# Patient Record
Sex: Female | Born: 1935 | Race: White | Hispanic: No | Smoking: Never smoker
Health system: Southern US, Community
[De-identification: ages and names within clinical notes are randomized; demographics above are authoritative.]

## PROBLEM LIST (undated history)

## (undated) DIAGNOSIS — I4891 Unspecified atrial fibrillation: Secondary | ICD-10-CM

## (undated) DIAGNOSIS — N189 Chronic kidney disease, unspecified: Secondary | ICD-10-CM

## (undated) DIAGNOSIS — I340 Nonrheumatic mitral (valve) insufficiency: Secondary | ICD-10-CM

## (undated) DIAGNOSIS — C50919 Malignant neoplasm of unspecified site of unspecified female breast: Secondary | ICD-10-CM

## (undated) DIAGNOSIS — R911 Solitary pulmonary nodule: Secondary | ICD-10-CM

## (undated) DIAGNOSIS — M67919 Unspecified disorder of synovium and tendon, unspecified shoulder: Secondary | ICD-10-CM

## (undated) DIAGNOSIS — F329 Major depressive disorder, single episode, unspecified: Secondary | ICD-10-CM

## (undated) DIAGNOSIS — I428 Other cardiomyopathies: Secondary | ICD-10-CM

## (undated) DIAGNOSIS — I5022 Chronic systolic (congestive) heart failure: Secondary | ICD-10-CM

## (undated) DIAGNOSIS — F0390 Unspecified dementia without behavioral disturbance: Secondary | ICD-10-CM

## (undated) DIAGNOSIS — I10 Essential (primary) hypertension: Secondary | ICD-10-CM

## (undated) DIAGNOSIS — I712 Thoracic aortic aneurysm, without rupture, unspecified: Secondary | ICD-10-CM

## (undated) DIAGNOSIS — G4700 Insomnia, unspecified: Secondary | ICD-10-CM

## (undated) DIAGNOSIS — E785 Hyperlipidemia, unspecified: Secondary | ICD-10-CM

## (undated) DIAGNOSIS — Z9289 Personal history of other medical treatment: Secondary | ICD-10-CM

## (undated) DIAGNOSIS — M419 Scoliosis, unspecified: Secondary | ICD-10-CM

## (undated) DIAGNOSIS — E039 Hypothyroidism, unspecified: Secondary | ICD-10-CM

## (undated) DIAGNOSIS — I447 Left bundle-branch block, unspecified: Secondary | ICD-10-CM

## (undated) DIAGNOSIS — Z8701 Personal history of pneumonia (recurrent): Secondary | ICD-10-CM

## (undated) DIAGNOSIS — F32A Depression, unspecified: Secondary | ICD-10-CM

## (undated) DIAGNOSIS — I350 Nonrheumatic aortic (valve) stenosis: Secondary | ICD-10-CM

## (undated) DIAGNOSIS — J449 Chronic obstructive pulmonary disease, unspecified: Secondary | ICD-10-CM

## (undated) HISTORY — PX: BREAST LUMPECTOMY: SHX2

## (undated) HISTORY — DX: Left bundle-branch block, unspecified: I44.7

## (undated) HISTORY — DX: Malignant neoplasm of unspecified site of unspecified female breast: C50.919

## (undated) HISTORY — DX: Essential (primary) hypertension: I10

## (undated) HISTORY — DX: Nonrheumatic aortic (valve) stenosis: I35.0

## (undated) HISTORY — DX: Unspecified atrial fibrillation: I48.91

## (undated) HISTORY — DX: Chronic kidney disease, unspecified: N18.9

## (undated) HISTORY — DX: Chronic systolic (congestive) heart failure: I50.22

## (undated) HISTORY — DX: Unspecified disorder of synovium and tendon, unspecified shoulder: M67.919

## (undated) HISTORY — DX: Unspecified dementia, unspecified severity, without behavioral disturbance, psychotic disturbance, mood disturbance, and anxiety: F03.90

## (undated) HISTORY — DX: Thoracic aortic aneurysm, without rupture: I71.2

## (undated) HISTORY — DX: Chronic obstructive pulmonary disease, unspecified: J44.9

## (undated) HISTORY — DX: Hyperlipidemia, unspecified: E78.5

## (undated) HISTORY — DX: Major depressive disorder, single episode, unspecified: F32.9

## (undated) HISTORY — PX: CATARACT EXTRACTION: SUR2

## (undated) HISTORY — DX: Scoliosis, unspecified: M41.9

## (undated) HISTORY — DX: Thoracic aortic aneurysm, without rupture, unspecified: I71.20

## (undated) HISTORY — DX: Nonrheumatic mitral (valve) insufficiency: I34.0

## (undated) HISTORY — DX: Solitary pulmonary nodule: R91.1

## (undated) HISTORY — DX: Personal history of pneumonia (recurrent): Z87.01

## (undated) HISTORY — DX: Personal history of other medical treatment: Z92.89

## (undated) HISTORY — DX: Hypothyroidism, unspecified: E03.9

## (undated) HISTORY — DX: Depression, unspecified: F32.A

## (undated) HISTORY — DX: Other cardiomyopathies: I42.8

## (undated) HISTORY — DX: Insomnia, unspecified: G47.00

---

## 1976-05-27 HISTORY — PX: OTHER SURGICAL HISTORY: SHX169

## 1993-05-27 DIAGNOSIS — C50919 Malignant neoplasm of unspecified site of unspecified female breast: Secondary | ICD-10-CM

## 1993-05-27 HISTORY — DX: Malignant neoplasm of unspecified site of unspecified female breast: C50.919

## 2002-08-10 ENCOUNTER — Ambulatory Visit: Admission: RE | Admit: 2002-08-10 | Discharge: 2002-08-10 | Payer: Self-pay | Admitting: Internal Medicine

## 2002-08-10 ENCOUNTER — Encounter: Payer: Self-pay | Admitting: Cardiology

## 2005-05-24 ENCOUNTER — Encounter: Payer: Self-pay | Admitting: Internal Medicine

## 2005-08-25 DIAGNOSIS — I4891 Unspecified atrial fibrillation: Secondary | ICD-10-CM

## 2005-08-25 HISTORY — DX: Unspecified atrial fibrillation: I48.91

## 2005-08-26 ENCOUNTER — Ambulatory Visit: Payer: Self-pay | Admitting: Cardiology

## 2005-08-26 ENCOUNTER — Inpatient Hospital Stay (HOSPITAL_COMMUNITY): Admission: EM | Admit: 2005-08-26 | Discharge: 2005-09-06 | Payer: Self-pay | Admitting: Emergency Medicine

## 2005-08-27 ENCOUNTER — Ambulatory Visit: Payer: Self-pay | Admitting: Internal Medicine

## 2005-08-27 ENCOUNTER — Encounter: Payer: Self-pay | Admitting: Cardiovascular Disease

## 2005-08-29 ENCOUNTER — Encounter: Payer: Self-pay | Admitting: Cardiology

## 2005-09-03 ENCOUNTER — Ambulatory Visit: Payer: Self-pay | Admitting: Hematology and Oncology

## 2005-09-06 ENCOUNTER — Ambulatory Visit: Payer: Self-pay | Admitting: Gastroenterology

## 2005-09-08 ENCOUNTER — Ambulatory Visit: Payer: Self-pay | Admitting: Cardiology

## 2005-09-08 ENCOUNTER — Inpatient Hospital Stay (HOSPITAL_COMMUNITY): Admission: EM | Admit: 2005-09-08 | Discharge: 2005-09-10 | Payer: Self-pay | Admitting: Emergency Medicine

## 2005-09-18 ENCOUNTER — Ambulatory Visit: Payer: Self-pay | Admitting: Cardiology

## 2005-09-23 ENCOUNTER — Ambulatory Visit: Payer: Self-pay | Admitting: Hematology and Oncology

## 2005-10-22 ENCOUNTER — Ambulatory Visit: Payer: Self-pay | Admitting: Cardiology

## 2005-10-22 ENCOUNTER — Ambulatory Visit: Payer: Self-pay

## 2005-10-22 ENCOUNTER — Encounter: Payer: Self-pay | Admitting: Cardiology

## 2005-10-23 ENCOUNTER — Ambulatory Visit: Payer: Self-pay | Admitting: Internal Medicine

## 2005-11-04 ENCOUNTER — Ambulatory Visit: Payer: Self-pay

## 2005-11-04 ENCOUNTER — Ambulatory Visit: Payer: Self-pay | Admitting: Cardiology

## 2005-11-04 ENCOUNTER — Ambulatory Visit: Admission: RE | Admit: 2005-11-04 | Discharge: 2005-11-04 | Payer: Self-pay | Admitting: Cardiology

## 2005-11-21 ENCOUNTER — Ambulatory Visit: Payer: Self-pay | Admitting: Internal Medicine

## 2005-12-11 ENCOUNTER — Ambulatory Visit: Payer: Self-pay | Admitting: Cardiology

## 2006-01-03 ENCOUNTER — Ambulatory Visit: Payer: Self-pay | Admitting: Hematology and Oncology

## 2006-01-07 LAB — COMPREHENSIVE METABOLIC PANEL WITH GFR
ALT: 10 U/L (ref 0–40)
AST: 14 U/L (ref 0–37)
Albumin: 4.1 g/dL (ref 3.5–5.2)
Alkaline Phosphatase: 83 U/L (ref 39–117)
BUN: 23 mg/dL (ref 6–23)
CO2: 29 meq/L (ref 19–32)
Calcium: 9 mg/dL (ref 8.4–10.5)
Chloride: 106 meq/L (ref 96–112)
Creatinine, Ser: 1.02 mg/dL (ref 0.40–1.20)
Glucose, Bld: 97 mg/dL (ref 70–99)
Potassium: 3.6 meq/L (ref 3.5–5.3)
Sodium: 143 meq/L (ref 135–145)
Total Bilirubin: 1 mg/dL (ref 0.3–1.2)
Total Protein: 6.3 g/dL (ref 6.0–8.3)

## 2006-01-07 LAB — CBC WITH DIFFERENTIAL/PLATELET
EOS%: 2 % (ref 0.0–7.0)
LYMPH%: 28.2 % (ref 14.0–48.0)
MCH: 33 pg (ref 26.0–34.0)
MCV: 96 fL (ref 81.0–101.0)
MONO%: 7.5 % (ref 0.0–13.0)
RBC: 3.62 10*6/uL — ABNORMAL LOW (ref 3.70–5.32)
RDW: 14.3 % (ref 11.3–14.5)

## 2006-01-07 LAB — AFP TUMOR MARKER: AFP-Tumor Marker: 15.6 ng/mL — ABNORMAL HIGH (ref 0.0–8.0)

## 2006-01-20 ENCOUNTER — Ambulatory Visit (HOSPITAL_COMMUNITY): Admission: RE | Admit: 2006-01-20 | Discharge: 2006-01-20 | Payer: Self-pay | Admitting: Hematology and Oncology

## 2006-02-04 ENCOUNTER — Ambulatory Visit: Payer: Self-pay | Admitting: Gastroenterology

## 2006-03-10 ENCOUNTER — Ambulatory Visit: Payer: Self-pay | Admitting: Cardiology

## 2006-03-12 ENCOUNTER — Ambulatory Visit: Payer: Self-pay | Admitting: Cardiology

## 2006-04-11 ENCOUNTER — Ambulatory Visit: Payer: Self-pay | Admitting: Internal Medicine

## 2006-05-06 ENCOUNTER — Ambulatory Visit: Payer: Self-pay | Admitting: Internal Medicine

## 2006-05-07 LAB — CONVERTED CEMR LAB
Calcium: 9.7 mg/dL (ref 8.4–10.5)
Chloride: 107 meq/L (ref 96–112)
GFR calc non Af Amer: 51 mL/min
Glucose, Bld: 102 mg/dL — ABNORMAL HIGH (ref 70–99)
Vitamin B-12: 240 pg/mL (ref 211–911)

## 2006-05-08 ENCOUNTER — Ambulatory Visit: Payer: Self-pay | Admitting: Cardiology

## 2006-06-27 DIAGNOSIS — R911 Solitary pulmonary nodule: Secondary | ICD-10-CM

## 2006-06-27 HISTORY — DX: Solitary pulmonary nodule: R91.1

## 2006-07-02 ENCOUNTER — Ambulatory Visit: Payer: Self-pay | Admitting: Hematology and Oncology

## 2006-07-07 ENCOUNTER — Ambulatory Visit (HOSPITAL_COMMUNITY): Admission: RE | Admit: 2006-07-07 | Discharge: 2006-07-07 | Payer: Self-pay | Admitting: Hematology and Oncology

## 2006-07-07 LAB — CBC WITH DIFFERENTIAL/PLATELET
BASO%: 0.5 % (ref 0.0–2.0)
LYMPH%: 19.3 % (ref 14.0–48.0)
MCHC: 35.1 g/dL (ref 32.0–36.0)
MCV: 96.5 fL (ref 81.0–101.0)
MONO%: 6.9 % (ref 0.0–13.0)
NEUT%: 72.3 % (ref 39.6–76.8)
Platelets: 179 10*3/uL (ref 145–400)
RBC: 4.04 10*6/uL (ref 3.70–5.32)

## 2006-07-07 LAB — COMPREHENSIVE METABOLIC PANEL
ALT: 12 U/L (ref 0–35)
Alkaline Phosphatase: 78 U/L (ref 39–117)
Creatinine, Ser: 0.77 mg/dL (ref 0.40–1.20)
Glucose, Bld: 101 mg/dL — ABNORMAL HIGH (ref 70–99)
Sodium: 142 mEq/L (ref 135–145)
Total Bilirubin: 1.6 mg/dL — ABNORMAL HIGH (ref 0.3–1.2)
Total Protein: 6.8 g/dL (ref 6.0–8.3)

## 2006-07-08 LAB — AFP TUMOR MARKER: AFP-Tumor Marker: 16.7 ng/mL — ABNORMAL HIGH (ref 0.0–8.0)

## 2006-07-08 LAB — FERRITIN: Ferritin: 169 ng/mL (ref 10–291)

## 2006-08-23 ENCOUNTER — Encounter: Admission: RE | Admit: 2006-08-23 | Discharge: 2006-08-23 | Payer: Self-pay | Admitting: Orthopedic Surgery

## 2006-09-18 ENCOUNTER — Ambulatory Visit: Payer: Self-pay | Admitting: Cardiology

## 2006-09-18 LAB — CONVERTED CEMR LAB
Basophils Relative: 0.7 % (ref 0.0–1.0)
CO2: 32 meq/L (ref 19–32)
Eosinophils Relative: 3 % (ref 0.0–5.0)
GFR calc Af Amer: 69 mL/min
Glucose, Bld: 97 mg/dL (ref 70–99)
Hemoglobin: 12.2 g/dL (ref 12.0–15.0)
Lymphocytes Relative: 26.9 % (ref 12.0–46.0)
Monocytes Absolute: 0.3 10*3/uL (ref 0.2–0.7)
Monocytes Relative: 8.4 % (ref 3.0–11.0)
Neutro Abs: 2.2 10*3/uL (ref 1.4–7.7)
Neutrophils Relative %: 61 % (ref 43.0–77.0)
Potassium: 4.4 meq/L (ref 3.5–5.1)
Sodium: 142 meq/L (ref 135–145)
WBC: 3.5 10*3/uL — ABNORMAL LOW (ref 4.5–10.5)

## 2006-10-24 ENCOUNTER — Ambulatory Visit: Payer: Self-pay | Admitting: Cardiology

## 2006-10-28 ENCOUNTER — Ambulatory Visit: Payer: Self-pay | Admitting: Internal Medicine

## 2006-10-28 LAB — CONVERTED CEMR LAB
AST: 16 units/L (ref 0–37)
BUN: 20 mg/dL (ref 6–23)
Basophils Absolute: 0 10*3/uL (ref 0.0–0.1)
Calcium: 9.2 mg/dL (ref 8.4–10.5)
Eosinophils Absolute: 0.1 10*3/uL (ref 0.0–0.6)
GFR calc Af Amer: 69 mL/min
GFR calc non Af Amer: 57 mL/min
Glucose, Bld: 147 mg/dL — ABNORMAL HIGH (ref 70–99)
MCHC: 34.5 g/dL (ref 30.0–36.0)
Monocytes Relative: 9.2 % (ref 3.0–11.0)
Potassium: 3.7 meq/L (ref 3.5–5.1)
RBC: 3.54 M/uL — ABNORMAL LOW (ref 3.87–5.11)
RDW: 12.7 % (ref 11.5–14.6)
TSH: 0.18 microintl units/mL — ABNORMAL LOW (ref 0.35–5.50)
Vitamin B-12: >1500 pg/mL — ABNORMAL HIGH (ref 211–911)

## 2006-12-12 DIAGNOSIS — I10 Essential (primary) hypertension: Secondary | ICD-10-CM | POA: Insufficient documentation

## 2006-12-12 DIAGNOSIS — E039 Hypothyroidism, unspecified: Secondary | ICD-10-CM | POA: Insufficient documentation

## 2006-12-12 DIAGNOSIS — Z853 Personal history of malignant neoplasm of breast: Secondary | ICD-10-CM

## 2006-12-25 ENCOUNTER — Ambulatory Visit: Payer: Self-pay | Admitting: Internal Medicine

## 2006-12-26 ENCOUNTER — Encounter: Payer: Self-pay | Admitting: Internal Medicine

## 2006-12-26 DIAGNOSIS — M159 Polyosteoarthritis, unspecified: Secondary | ICD-10-CM | POA: Insufficient documentation

## 2006-12-26 DIAGNOSIS — I4891 Unspecified atrial fibrillation: Secondary | ICD-10-CM

## 2006-12-26 DIAGNOSIS — E785 Hyperlipidemia, unspecified: Secondary | ICD-10-CM

## 2006-12-26 DIAGNOSIS — I5022 Chronic systolic (congestive) heart failure: Secondary | ICD-10-CM | POA: Insufficient documentation

## 2006-12-26 DIAGNOSIS — N259 Disorder resulting from impaired renal tubular function, unspecified: Secondary | ICD-10-CM | POA: Insufficient documentation

## 2007-02-06 ENCOUNTER — Ambulatory Visit: Payer: Self-pay | Admitting: Internal Medicine

## 2007-02-06 LAB — CONVERTED CEMR LAB
ALT: 16 units/L (ref 0–35)
LDL Cholesterol: 71 mg/dL (ref 0–99)
Triglycerides: 53 mg/dL (ref 0–149)
VLDL: 11 mg/dL (ref 0–40)

## 2007-03-09 ENCOUNTER — Ambulatory Visit: Payer: Self-pay | Admitting: Internal Medicine

## 2007-04-27 ENCOUNTER — Ambulatory Visit: Payer: Self-pay | Admitting: Internal Medicine

## 2007-04-30 ENCOUNTER — Telehealth: Payer: Self-pay | Admitting: Internal Medicine

## 2007-06-10 ENCOUNTER — Ambulatory Visit: Payer: Self-pay | Admitting: Cardiology

## 2007-07-08 ENCOUNTER — Ambulatory Visit: Payer: Self-pay | Admitting: Hematology and Oncology

## 2007-07-10 ENCOUNTER — Encounter: Payer: Self-pay | Admitting: Internal Medicine

## 2007-07-10 ENCOUNTER — Ambulatory Visit (HOSPITAL_COMMUNITY): Admission: RE | Admit: 2007-07-10 | Discharge: 2007-07-10 | Payer: Self-pay | Admitting: Hematology and Oncology

## 2007-07-10 ENCOUNTER — Ambulatory Visit: Payer: Self-pay | Admitting: Internal Medicine

## 2007-07-10 DIAGNOSIS — H9190 Unspecified hearing loss, unspecified ear: Secondary | ICD-10-CM | POA: Insufficient documentation

## 2007-07-10 LAB — COMPREHENSIVE METABOLIC PANEL
ALT: 15 U/L (ref 0–35)
Albumin: 3.7 g/dL (ref 3.5–5.2)
CO2: 32 mEq/L (ref 19–32)
Calcium: 9.1 mg/dL (ref 8.4–10.5)
Chloride: 104 mEq/L (ref 96–112)
Potassium: 4.3 mEq/L (ref 3.5–5.3)
Sodium: 140 mEq/L (ref 135–145)
Total Protein: 6.1 g/dL (ref 6.0–8.3)

## 2007-07-10 LAB — CBC WITH DIFFERENTIAL/PLATELET
BASO%: 0 % (ref 0.0–2.0)
HCT: 36.6 % (ref 34.8–46.6)
MCHC: 32.8 g/dL (ref 32.0–36.0)
MONO#: 0.2 10*3/uL (ref 0.1–0.9)
NEUT%: 61.4 % (ref 39.6–76.8)
RBC: 3.78 10*6/uL (ref 3.70–5.32)
WBC: 3.2 10*3/uL — ABNORMAL LOW (ref 3.9–10.0)
lymph#: 0.9 10*3/uL (ref 0.9–3.3)

## 2007-07-10 LAB — CANCER ANTIGEN 27.29: CA 27.29: 25 U/mL (ref 0–39)

## 2007-07-10 LAB — CONVERTED CEMR LAB

## 2007-07-13 LAB — CONVERTED CEMR LAB
ALT: 14 units/L (ref 0–35)
CO2: 32 meq/L (ref 19–32)
Chloride: 108 meq/L (ref 96–112)
Creatinine, Ser: 1.2 mg/dL (ref 0.4–1.2)
Glucose, Bld: 108 mg/dL — ABNORMAL HIGH (ref 70–99)
Potassium: 4.7 meq/L (ref 3.5–5.1)
Sodium: 144 meq/L (ref 135–145)

## 2007-08-05 ENCOUNTER — Encounter: Payer: Self-pay | Admitting: Internal Medicine

## 2007-08-06 ENCOUNTER — Encounter: Payer: Self-pay | Admitting: Internal Medicine

## 2007-08-12 ENCOUNTER — Encounter: Payer: Self-pay | Admitting: Internal Medicine

## 2007-09-21 ENCOUNTER — Telehealth: Payer: Self-pay | Admitting: Internal Medicine

## 2007-11-06 ENCOUNTER — Encounter: Payer: Self-pay | Admitting: Internal Medicine

## 2007-11-10 ENCOUNTER — Telehealth: Payer: Self-pay | Admitting: Internal Medicine

## 2007-12-01 ENCOUNTER — Telehealth: Payer: Self-pay | Admitting: Internal Medicine

## 2007-12-03 ENCOUNTER — Ambulatory Visit: Payer: Self-pay | Admitting: Internal Medicine

## 2007-12-11 ENCOUNTER — Encounter: Payer: Self-pay | Admitting: Internal Medicine

## 2007-12-15 ENCOUNTER — Ambulatory Visit: Payer: Self-pay | Admitting: Cardiology

## 2007-12-15 ENCOUNTER — Ambulatory Visit: Payer: Self-pay | Admitting: Internal Medicine

## 2007-12-18 ENCOUNTER — Telehealth: Payer: Self-pay | Admitting: Internal Medicine

## 2007-12-28 ENCOUNTER — Ambulatory Visit: Payer: Self-pay

## 2007-12-28 ENCOUNTER — Encounter: Payer: Self-pay | Admitting: Internal Medicine

## 2007-12-28 ENCOUNTER — Ambulatory Visit: Payer: Self-pay | Admitting: Internal Medicine

## 2007-12-28 ENCOUNTER — Encounter: Payer: Self-pay | Admitting: Cardiology

## 2007-12-28 LAB — CONVERTED CEMR LAB
ALT: 12 units/L (ref 0–35)
Alkaline Phosphatase: 66 units/L (ref 39–117)
Basophils Absolute: 0 10*3/uL (ref 0.0–0.1)
Bilirubin, Direct: 0.2 mg/dL (ref 0.0–0.3)
CO2: 32 meq/L (ref 19–32)
Calcium: 9.1 mg/dL (ref 8.4–10.5)
Cholesterol: 170 mg/dL (ref 0–200)
Glucose, Bld: 94 mg/dL (ref 70–99)
HDL: 64.1 mg/dL (ref >39.0)
LDL Cholesterol: 91 mg/dL (ref 0–99)
Lymphocytes Relative: 23.5 % (ref 12.0–46.0)
MCHC: 34.8 g/dL (ref 30.0–36.0)
Monocytes Relative: 7.9 % (ref 3.0–12.0)
Neutrophils Relative %: 64.7 % (ref 43.0–77.0)
Platelets: 179 10*3/uL (ref 150–400)
Potassium: 4.6 meq/L (ref 3.5–5.1)
RDW: 12.5 % (ref 11.5–14.6)
Sodium: 143 meq/L (ref 135–145)
TSH: 1.38 microintl units/mL (ref 0.35–5.50)
Total Bilirubin: 1.2 mg/dL (ref 0.3–1.2)
Total CHOL/HDL Ratio: 2.7
Total Protein: 5.9 g/dL — ABNORMAL LOW (ref 6.0–8.3)
Triglycerides: 73 mg/dL (ref 0–149)
VLDL: 15 mg/dL (ref 0–40)

## 2007-12-29 ENCOUNTER — Ambulatory Visit: Payer: Self-pay

## 2007-12-29 ENCOUNTER — Encounter: Payer: Self-pay | Admitting: Internal Medicine

## 2008-01-19 ENCOUNTER — Encounter: Payer: Self-pay | Admitting: Internal Medicine

## 2008-01-26 ENCOUNTER — Encounter: Payer: Self-pay | Admitting: Internal Medicine

## 2008-01-29 ENCOUNTER — Encounter: Payer: Self-pay | Admitting: Internal Medicine

## 2008-02-17 ENCOUNTER — Telehealth: Payer: Self-pay | Admitting: Internal Medicine

## 2008-02-19 ENCOUNTER — Encounter: Payer: Self-pay | Admitting: Internal Medicine

## 2008-02-24 ENCOUNTER — Encounter: Payer: Self-pay | Admitting: Internal Medicine

## 2008-03-08 ENCOUNTER — Telehealth: Payer: Self-pay | Admitting: Internal Medicine

## 2008-04-20 ENCOUNTER — Ambulatory Visit: Payer: Self-pay | Admitting: Internal Medicine

## 2008-04-27 ENCOUNTER — Telehealth: Payer: Self-pay | Admitting: Internal Medicine

## 2008-05-12 ENCOUNTER — Telehealth: Payer: Self-pay | Admitting: Internal Medicine

## 2008-05-27 DIAGNOSIS — Z8701 Personal history of pneumonia (recurrent): Secondary | ICD-10-CM

## 2008-05-27 HISTORY — DX: Personal history of pneumonia (recurrent): Z87.01

## 2008-06-07 ENCOUNTER — Telehealth: Payer: Self-pay | Admitting: Internal Medicine

## 2008-07-26 ENCOUNTER — Ambulatory Visit: Payer: Self-pay | Admitting: Hematology and Oncology

## 2008-07-29 ENCOUNTER — Telehealth (INDEPENDENT_AMBULATORY_CARE_PROVIDER_SITE_OTHER): Payer: Self-pay | Admitting: *Deleted

## 2008-09-29 ENCOUNTER — Encounter: Payer: Self-pay | Admitting: Internal Medicine

## 2008-10-21 ENCOUNTER — Ambulatory Visit (HOSPITAL_COMMUNITY): Admission: RE | Admit: 2008-10-21 | Discharge: 2008-10-21 | Payer: Self-pay | Admitting: Orthopedic Surgery

## 2008-10-21 ENCOUNTER — Emergency Department (HOSPITAL_COMMUNITY): Admission: EM | Admit: 2008-10-21 | Discharge: 2008-10-21 | Payer: Self-pay | Admitting: Emergency Medicine

## 2008-11-25 ENCOUNTER — Telehealth: Payer: Self-pay | Admitting: Internal Medicine

## 2008-11-29 ENCOUNTER — Ambulatory Visit: Payer: Self-pay | Admitting: Diagnostic Radiology

## 2008-11-29 ENCOUNTER — Ambulatory Visit (HOSPITAL_BASED_OUTPATIENT_CLINIC_OR_DEPARTMENT_OTHER): Admission: RE | Admit: 2008-11-29 | Discharge: 2008-11-29 | Payer: Self-pay | Admitting: Internal Medicine

## 2008-11-29 ENCOUNTER — Ambulatory Visit: Payer: Self-pay | Admitting: Internal Medicine

## 2008-11-29 DIAGNOSIS — F329 Major depressive disorder, single episode, unspecified: Secondary | ICD-10-CM

## 2008-11-29 DIAGNOSIS — F3289 Other specified depressive episodes: Secondary | ICD-10-CM | POA: Insufficient documentation

## 2008-11-30 ENCOUNTER — Telehealth: Payer: Self-pay | Admitting: Internal Medicine

## 2008-12-01 ENCOUNTER — Telehealth: Payer: Self-pay | Admitting: Internal Medicine

## 2008-12-16 ENCOUNTER — Encounter: Admission: RE | Admit: 2008-12-16 | Discharge: 2008-12-16 | Payer: Self-pay | Admitting: General Practice

## 2009-01-18 ENCOUNTER — Ambulatory Visit: Payer: Self-pay | Admitting: Cardiology

## 2009-04-11 ENCOUNTER — Encounter: Payer: Self-pay | Admitting: Cardiology

## 2009-04-25 ENCOUNTER — Encounter: Payer: Self-pay | Admitting: Cardiology

## 2009-05-27 HISTORY — PX: OTHER SURGICAL HISTORY: SHX169

## 2009-05-30 ENCOUNTER — Ambulatory Visit: Payer: Self-pay

## 2009-05-30 ENCOUNTER — Encounter: Payer: Self-pay | Admitting: Cardiology

## 2009-05-30 ENCOUNTER — Ambulatory Visit: Payer: Self-pay | Admitting: Cardiovascular Disease

## 2009-05-30 ENCOUNTER — Ambulatory Visit (HOSPITAL_COMMUNITY): Admission: RE | Admit: 2009-05-30 | Discharge: 2009-05-30 | Payer: Self-pay | Admitting: Cardiology

## 2009-06-01 ENCOUNTER — Telehealth (INDEPENDENT_AMBULATORY_CARE_PROVIDER_SITE_OTHER): Payer: Self-pay | Admitting: *Deleted

## 2009-06-05 ENCOUNTER — Telehealth: Payer: Self-pay | Admitting: Internal Medicine

## 2009-07-03 ENCOUNTER — Telehealth: Payer: Self-pay | Admitting: Cardiology

## 2009-07-13 ENCOUNTER — Encounter: Admission: RE | Admit: 2009-07-13 | Discharge: 2009-07-13 | Payer: Self-pay | Admitting: Family Medicine

## 2009-07-31 ENCOUNTER — Ambulatory Visit (HOSPITAL_COMMUNITY): Admission: RE | Admit: 2009-07-31 | Discharge: 2009-07-31 | Payer: Self-pay | Admitting: Family Medicine

## 2009-07-31 ENCOUNTER — Encounter: Payer: Self-pay | Admitting: Internal Medicine

## 2009-10-12 ENCOUNTER — Encounter: Admission: RE | Admit: 2009-10-12 | Discharge: 2009-10-12 | Payer: Self-pay | Admitting: Family Medicine

## 2009-11-03 ENCOUNTER — Encounter: Admission: RE | Admit: 2009-11-03 | Discharge: 2009-11-03 | Payer: Self-pay | Admitting: Family Medicine

## 2009-12-11 ENCOUNTER — Encounter: Payer: Self-pay | Admitting: Internal Medicine

## 2010-02-26 ENCOUNTER — Encounter
Admission: RE | Admit: 2010-02-26 | Discharge: 2010-03-26 | Payer: Self-pay | Source: Home / Self Care | Admitting: Family Medicine

## 2010-04-17 ENCOUNTER — Ambulatory Visit: Payer: Self-pay | Admitting: Internal Medicine

## 2010-04-17 DIAGNOSIS — J449 Chronic obstructive pulmonary disease, unspecified: Secondary | ICD-10-CM

## 2010-04-17 DIAGNOSIS — J310 Chronic rhinitis: Secondary | ICD-10-CM

## 2010-04-17 DIAGNOSIS — J984 Other disorders of lung: Secondary | ICD-10-CM

## 2010-04-17 DIAGNOSIS — J4489 Other specified chronic obstructive pulmonary disease: Secondary | ICD-10-CM | POA: Insufficient documentation

## 2010-05-06 ENCOUNTER — Encounter: Payer: Self-pay | Admitting: Cardiology

## 2010-05-07 ENCOUNTER — Encounter: Payer: Self-pay | Admitting: Cardiology

## 2010-05-09 ENCOUNTER — Encounter: Payer: Self-pay | Admitting: Cardiology

## 2010-05-10 ENCOUNTER — Encounter: Payer: Self-pay | Admitting: Cardiology

## 2010-05-11 ENCOUNTER — Telehealth: Payer: Self-pay | Admitting: Cardiology

## 2010-05-15 ENCOUNTER — Ambulatory Visit: Payer: Self-pay | Admitting: Cardiology

## 2010-05-15 ENCOUNTER — Encounter: Payer: Self-pay | Admitting: Cardiology

## 2010-05-15 DIAGNOSIS — G309 Alzheimer's disease, unspecified: Secondary | ICD-10-CM

## 2010-05-15 DIAGNOSIS — F0280 Dementia in other diseases classified elsewhere without behavioral disturbance: Secondary | ICD-10-CM

## 2010-05-18 ENCOUNTER — Telehealth: Payer: Self-pay | Admitting: Cardiology

## 2010-05-23 LAB — CONVERTED CEMR LAB
BUN: 26 mg/dL — ABNORMAL HIGH (ref 6–23)
Basophils Absolute: 0 10*3/uL (ref 0.0–0.1)
Basophils Relative: 0.3 % (ref 0.0–3.0)
CO2: 35 meq/L — ABNORMAL HIGH (ref 19–32)
Chloride: 98 meq/L (ref 96–112)
Creatinine, Ser: 1.1 mg/dL (ref 0.4–1.2)
Digitoxin Lvl: 1.5 ng/mL (ref 0.8–2.0)
Glucose, Bld: 75 mg/dL (ref 70–99)
HCT: 37.7 % (ref 36.0–46.0)
Lymphocytes Relative: 21 % (ref 12.0–46.0)
MCHC: 34.1 g/dL (ref 30.0–36.0)
MCV: 100.6 fL — ABNORMAL HIGH (ref 78.0–100.0)
Monocytes Relative: 8.9 % (ref 3.0–12.0)
Neutro Abs: 2.8 10*3/uL (ref 1.4–7.7)
Neutrophils Relative %: 67.1 % (ref 43.0–77.0)
Platelets: 217 10*3/uL (ref 150.0–400.0)
WBC: 4.1 10*3/uL — ABNORMAL LOW (ref 4.5–10.5)

## 2010-05-24 ENCOUNTER — Ambulatory Visit: Payer: Self-pay | Admitting: Cardiology

## 2010-05-29 ENCOUNTER — Ambulatory Visit
Admission: RE | Admit: 2010-05-29 | Discharge: 2010-05-29 | Payer: Self-pay | Source: Home / Self Care | Attending: Internal Medicine | Admitting: Internal Medicine

## 2010-05-29 ENCOUNTER — Telehealth: Payer: Self-pay | Admitting: Cardiology

## 2010-06-01 ENCOUNTER — Telehealth: Payer: Self-pay | Admitting: Cardiology

## 2010-06-08 ENCOUNTER — Encounter: Payer: Self-pay | Admitting: Internal Medicine

## 2010-06-12 ENCOUNTER — Ambulatory Visit
Admission: RE | Admit: 2010-06-12 | Discharge: 2010-06-12 | Payer: Self-pay | Source: Home / Self Care | Attending: Cardiology | Admitting: Cardiology

## 2010-06-12 ENCOUNTER — Ambulatory Visit: Admission: RE | Admit: 2010-06-12 | Discharge: 2010-06-12 | Payer: Self-pay | Source: Home / Self Care

## 2010-06-12 ENCOUNTER — Encounter: Payer: Self-pay | Admitting: Cardiology

## 2010-06-12 ENCOUNTER — Other Ambulatory Visit: Payer: Self-pay | Admitting: Cardiology

## 2010-06-12 LAB — BASIC METABOLIC PANEL
BUN: 20 mg/dL (ref 6–23)
CO2: 32 mEq/L (ref 19–32)
Calcium: 9.2 mg/dL (ref 8.4–10.5)
Chloride: 102 mEq/L (ref 96–112)
Creatinine, Ser: 1.1 mg/dL (ref 0.4–1.2)
GFR: 52.64 mL/min — ABNORMAL LOW (ref >60.00)
Glucose, Bld: 93 mg/dL (ref 70–99)
Potassium: 4.5 mEq/L (ref 3.5–5.1)
Sodium: 144 mEq/L (ref 135–145)

## 2010-06-12 LAB — BRAIN NATRIURETIC PEPTIDE: Pro B Natriuretic peptide (BNP): 293.7 pg/mL — ABNORMAL HIGH (ref 0.0–100.0)

## 2010-06-12 LAB — CONVERTED CEMR LAB: POC INR: 1.7

## 2010-06-17 ENCOUNTER — Encounter: Payer: Self-pay | Admitting: Internal Medicine

## 2010-06-17 ENCOUNTER — Encounter: Payer: Self-pay | Admitting: Hematology and Oncology

## 2010-06-24 ENCOUNTER — Telehealth: Payer: Self-pay | Admitting: Internal Medicine

## 2010-06-24 LAB — CONVERTED CEMR LAB
ALT: 9 units/L (ref 0–35)
AST: 12 units/L (ref 0–37)
Albumin: 4 g/dL (ref 3.5–5.2)
BUN: 14 mg/dL (ref 6–23)
Chloride: 103 meq/L (ref 96–112)
Creatinine, Ser: 1.03 mg/dL (ref 0.40–1.20)
Eosinophils Absolute: 0.1 10*3/uL (ref 0.0–0.7)
Eosinophils Relative: 1 % (ref 0–5)
Free T4: 1.34 ng/dL (ref 0.80–1.80)
Glucose, Bld: 87 mg/dL (ref 70–99)
HCT: 37.6 % (ref 36.0–46.0)
Hemoglobin: 12.3 g/dL (ref 12.0–15.0)
Lymphocytes Relative: 9 % — ABNORMAL LOW (ref 12–46)
Lymphs Abs: 0.7 10*3/uL (ref 0.7–4.0)
MCV: 100.8 fL — ABNORMAL HIGH (ref 78.0–100.0)
Monocytes Absolute: 0.7 10*3/uL (ref 0.1–1.0)
Platelets: 224 10*3/uL (ref 150–400)
Potassium: 4.6 meq/L (ref 3.5–5.3)
RDW: 13.5 % (ref 11.5–15.5)
TSH: 1.23 microintl units/mL (ref 0.350–4.500)
Total Protein: 6.5 g/dL (ref 6.0–8.3)
WBC: 7.2 10*3/uL (ref 4.0–10.5)

## 2010-06-26 ENCOUNTER — Ambulatory Visit
Admission: RE | Admit: 2010-06-26 | Discharge: 2010-06-26 | Payer: Self-pay | Source: Home / Self Care | Attending: Internal Medicine | Admitting: Internal Medicine

## 2010-06-26 ENCOUNTER — Ambulatory Visit: Admit: 2010-06-26 | Payer: Self-pay | Admitting: Cardiology

## 2010-06-26 ENCOUNTER — Ambulatory Visit: Admit: 2010-06-26 | Payer: Self-pay

## 2010-06-28 NOTE — Progress Notes (Signed)
Summary: pt's dtr returned call   Phone Note Call from Patient   Caller: Daughter Misty Stanley  [phone] Reason for Call: Talk to Nurse Summary of Call: pt's dtr lisa returned your call-pls call Initial call taken by: Glynda Jaeger,  June 01, 2010 2:35 PM  Follow-up for Phone Call        I talked with pt's daughter--she will come with pt to appt with Dr Shirlee Latch 06/12/10

## 2010-06-28 NOTE — Progress Notes (Signed)
Summary: Start B-blocker  Medications Added CARVEDILOL 3.125 MG TABS (CARVEDILOL) Take one tablet by mouth twice a day       Phone Note Outgoing Call   Call placed by: Sherri Rad, RN, BSN,  July 03, 2009 4:49 PM Summary of Call: I LM for the pt's daughter, Misty Stanley, to call regarding initiation of Coreg 3.125mg  two times a day. Sherri Rad, RN, BSN  July 03, 2009 4:50 PM  I spoke with Misty Stanley and made her aware to have the pt start Coreg 3.125mg  two times a day. Lisa verbalizes understanding. She states the pt has trouble remembering to take her evening meds sometimes. I advised her to have the pt take it around supper time if she can so she doesn't have to remember it later in the evening. They will try this medication.  Initial call taken by: Sherri Rad, RN, BSN,  July 04, 2009 11:46 AM    New/Updated Medications: CARVEDILOL 3.125 MG TABS (CARVEDILOL) Take one tablet by mouth twice a day Prescriptions: CARVEDILOL 3.125 MG TABS (CARVEDILOL) Take one tablet by mouth twice a day  #60 x 6   Entered by:   Sherri Rad, RN, BSN   Authorized by:   Lenoria Farrier, MD, [facility]   Signed by:   Sherri Rad, RN, BSN on 07/04/2009   Method used:   Electronically to        [facility]. [phone]* (retail)       [address]       [place], [place]  [address]       Ph: [phone]       Fax: [phone]   RxID:   [id number]

## 2010-06-28 NOTE — Medication Information (Signed)
Summary: rov/sp  Anticoagulant Therapy  Managed by: Bethena Midget, RN, BSN Referring MD: Shirlee Latch MD, [place] PCP: Dr Loura Back MD: Eden Emms MD, Theron Arista Indication 1: Atrial Fibrillation Lab Used: [facility] [place] Site: [address] INR POC 1.6 INR RANGE 2.0-3.0  Dietary changes: no    Health status changes: no    Bleeding/hemorrhagic complications: no    Recent/future hospitalizations: no    Any changes in medication regimen? no    Recent/future dental: no  Any missed doses?: no       Is patient compliant with meds? yes       Allergies: 1)  ! Pcn  Anticoagulation Management History:      The patient is taking warfarin and comes in today for a routine follow up visit.  Positive risk factors for bleeding include an age of 75 years or older.  The bleeding index is 'intermediate risk'.  Positive CHADS2 values include History of CHF, History of HTN, and Age > 11 years old.  Anticoagulation responsible provider: Eden Emms MD, Theron Arista.  INR POC: 1.6.  Cuvette Lot#: [id number].  Exp: 06/2011.    Anticoagulation Management Assessment/Plan:      The patient's current anticoagulation dose is Warfarin sodium 2.5 mg tabs: Use as directed by Anticoagualtion Clinic.  The target INR is 2.0-3.0.  The next INR is due 06/12/2010.  Anticoagulation instructions were given to patient/daughter.  Results were reviewed/authorized by Bethena Midget, RN, BSN.  She was notified by Bethena Midget, RN, BSN.         Prior Anticoagulation Instructions: INR 2.2  Spoke with pt's daughter.  Continue same dose of 2 tablets every day.    Current Anticoagulation Instructions: INR 1.6 Today take 3 pills then change dose to 2 pills everyday except  on Sundays take 3 pills. Recheck in 2 weeks.  Prescriptions: WARFARIN SODIUM 2.5 MG TABS (WARFARIN SODIUM) Use as directed by Anticoagualtion Clinic  #60 x 3   Entered by:   Tiffany Muse, RN, BSN   Authorized by:   Dalton McLean, MD   Signed by:    Tiffany Muse, RN, BSN on 05/24/2010   Method used:   Electronically to        [facility]  [address] #[id number]* (retail)       [address]       [place], [place]  [address]       Ph: [phone]       Fax: [phone]   RxID:   [id number]

## 2010-06-28 NOTE — Progress Notes (Signed)
Summary: pt took two days worth of meds   Phone Note Call from Patient Call back at [phone]   Caller: Daughter/lisa Reason for Call: Talk to Nurse Summary of Call: pt daughter states pt took two days worth of meds in one day. pt daughter would like to know what does she have to do. pt does not have any  reaction to meds. pt daughter wants to be sure what to do. Initial call taken by: Roe Coombs,  May 18, 2010 10:12 AM  Follow-up for Phone Call        04/18/10--10:15 am--daughter calling stating [name]) took double dose of all meds by accident--advised watch for any decrease in pulse, lethargy, or any other behavior that is unusual---pt's daughter agrees--also advised to go to nearest ED if any other symptoms develope--nt Follow-up by: Ledon Snare, RN,  May 18, 2010 10:20 AM

## 2010-06-28 NOTE — Progress Notes (Signed)
Summary: Namenda Denial  Phone Note Refill Request Message from:  Fax from Pharmacy on June 05, 2009 10:46 AM  Refills Requested: Medication #1:  NAMENDA TITRATION PAK  TABS 5mg  two times a day   Dosage confirmed as above?Dosage Confirmed   Brand Name Necessary? No   Supply Requested: 1 month   Last Refilled: 02/22/2009  Method Requested: Electronic Next Appointment Scheduled: none Initial call taken by: Roselle Locus,  June 05, 2009 10:46 AM  Follow-up for Phone Call        call pt - did she transfer care to Dr. Duaine Dredge.  if yes, please forward refill request  Follow-up by: D. Thomos Lemons DO,  June 05, 2009 1:21 PM  Additional Follow-up for Phone Call Additional follow up Details #1::        Rx denial calledto pharmacy, pharmacy advised to forward refill request to Dr Duaine Dredge Additional Follow-up by: Glendell Docker CMA,  June 05, 2009 4:22 PM

## 2010-06-28 NOTE — Progress Notes (Signed)
Summary: dicuss plan [place]   Phone Note Call from Patient Call back at Home Phone [phone]   Caller: Daughter- Misty Stanley [phone] or [phone] Reason for Call: Talk to Nurse Summary of Call[place] her [place] of care.  Initial call taken by: Lorne Skeens,  May 11, 2010 12:09 PM  Follow-up for Phone Call        I spoke with the pt's daughter, Misty Stanley. She states that the pt is with her sister in [place]. On sunday, she became SOB and was taken to the ER with a diagnosis of a-fib. She had an Echo on monday per Misty Stanley, that showed an EF of 25%. She was started on lasix and coumadin on tuesday. She had a myoview on wednsday that was negative for blockage per Misty Stanley, but did show some scar. The pt was d/c from [facility] on thursday, but was still in a-fib. The pt was d/c'ed on potassium, lasix, digoxin, coumadin, aspirin, and her citolapram was increased at d/c. Her diovan was stopped. Per Misty Stanley, the pt was instructed to have weekly INR checks with plans for a DCCV in a month. The pt was scheduled to come from [place] after Christmas. The pt's daughter is concerned as to what they need to do. The pt went through the same thing 5 years ago and Dr. Juanda Chance would not let the pt be d/c'ed until she was in rythym. She also experienced a GI bleed on coumadin per Misty Stanley. I explained the best thing may be to bring her home and let her see Dr. Juanda Chance next week so a plan can be developed closer to home. I also explained with her decreased EF, we need to be careful that she does not go into heart failure with the a-fib. Misty Stanley is agreeable to bring the pt home and see Dr. Juanda Chance on 05/15/10 @ 3:30pm. I have advised her to get records of her echo, myoview, & d/c summary to bring with her. She verbalizes understanding. Follow-up by: Sherri Rad, RN, BSN,  May 11, 2010 1:52 PM

## 2010-06-28 NOTE — Assessment & Plan Note (Signed)
Summary: rov  Medications Added SIMVASTATIN 80 MG TABS (SIMVASTATIN) Take one tablet by mouth daily at bedtime SIMVASTATIN 20 MG TABS (SIMVASTATIN) Take one tablet by mouth daily at bedtime NAMENDA TITRATION PAK  TABS (MEMANTINE HCL) 5mg  once daily CARVEDILOL 12.5 MG TABS (CARVEDILOL) Take one tablet by mouth twice a day ASPIRIN 81 MG TBEC (ASPIRIN) Take one tablet by mouth daily CEREFOLIN NAC 6-2-600 MG TABS (METHYLFOL-METHYLCOB-ACETYLCYST) once daily ANASTROZOLE 1 MG TABS (ANASTROZOLE) once daily DIGOXIN 0.125 MG TABS (DIGOXIN) once daily FUROSEMIDE 40 MG TABS (FUROSEMIDE) Take one tablet by mouth daily. POTASSIUM CHLORIDE CR 10 MEQ CR-CAPS (POTASSIUM CHLORIDE) Take one tablet by mouth once daily WARFARIN SODIUM 5 MG TABS (WARFARIN SODIUM) Use as directed by Anticoagulation Clinic DIOVAN 160 MG TABS (VALSARTAN) Take 1/2 tablet by mouth daily DIOVAN 80 MG TABS (VALSARTAN) Take one tablet by mouth daily CELEXA 20 MG TABS (CITALOPRAM HYDROBROMIDE) take one tab by mouth once daily      Allergies Added:   Visit Type:  rov Primary Provider:  Dr Duaine Dredge -Changed for 1 month now  CC:  shortness of breath-occ and .  History of Present Illness: [name] return for followup management of atrial fibrillation and cardiomyopathy. She was hospitalized in April of 2007 with atrial fibrillation and a cardiomyopathy with an ejection fraction of 30%. She never had a catheterization but she had a negative Myoview scan. Her age her fibrillation was converted and controlled with amiodarone and ejection fraction improved to 40% by echo in August of 2009. I last saw her in the summer of 2010.  Amiodarone was discontinued sometime before that for side effects of anorexia  She was recently visiting her daughter in [place] and she developed increasing symptoms of shortness of breath and was hospitalized. She had a rapid rate to atrial fibrillation and her echocardiogram showed an ejection fraction of 20%.  She was started on Coumadin and her rate was controlled and consideration was given to DC cardioversion. She returns now today for further consultation regarding management of her LV dysfunction, CHF, and atrial fibrillation.  she has felt fairly well since she returned to [place].    Her daughter from [place] was with her today. She has another daughter who lives in [place] and she's been getting  cancer treatments in [place].  She also has a pulmonary nodule this apparently has been stable.  she lives in [place] by herself with home health nurses coming in to help. Dr. Duaine Dredge is her primary care physician.    Current Medications (verified): 1)  Aricept 10 Mg  Tabs (Donepezil Hcl) .... Take 1 Tablet By Mouth Once A Day 2)  Simvastatin 80 Mg Tabs (Simvastatin) .... Take One Tablet By Mouth Daily At Bedtime 3)  Synthroid 25 Mcg  Tabs (Levothyroxine Sodium) .... Take 1 Tablet By Mouth Once A Day 4)  Namenda Titration Pak  Tabs (Memantine Hcl) .... 5mg  Two Times A Day 5)  Carvedilol 12.5 Mg Tabs (Carvedilol) .... Take One Tablet By Mouth Twice A Day 6)  Spiriva Handihaler 18 Mcg Caps (Tiotropium Bromide Monohydrate) .Marland Kitchen.. 1 Daily 7)  Aspirin 81 Mg Tbec (Aspirin) .... Take One Tablet By Mouth Daily 8)  Cerefolin Nac 6-2-600 Mg Tabs (Methylfol-Methylcob-Acetylcyst) .... Once Daily 9)  Anastrozole 1 Mg Tabs (Anastrozole) .... Once Daily 10)  Digoxin 0.125 Mg Tabs (Digoxin) .... Once Daily 11)  Furosemide 40 Mg Tabs (Furosemide) .... Take One Tablet By Mouth Daily. 12)  Potassium Chloride Cr 10 Meq Cr-Caps (Potassium Chloride) .Marland KitchenMarland KitchenMarland Kitchen  Take One Tablet By Mouth Once Daily 13)  Warfarin Sodium 5 Mg Tabs (Warfarin Sodium) .... Use As Directed By Anticoagulation Clinic  Allergies (verified): 1)  ! Pcn  Past History:  Past Medical History: Reviewed history from 01/17/2009 and no changes required. Breast cancer, hx of  1995.  S/P lumpectomy and radiation (node, ER, PR  negative) Hypertension Hypothyroidism  Elevated Glucose  Atrial fibrillation - off anticoagulation due to peri rectal hematoma Cirrhosis Dementia Renal insufficiency Congestive heart failure Hyperlipidemia Pulmonary Nodule - stable 02/08 LBBB  2. History of paroxysmal atrial fibrillation, now controlled on     amiodarone not coumadin candidate. 3. History of rate-related cardiomyopathy, now resolved with ejection     fraction of 40% 8/09. 4. History of systolic heart failure,  5. Mild aortic stenosis with mean aortic valve gradient of 10 mm by     last echo. 6. History of remote breast cancer with new breast mass 2009 7. Pulmonary nodule on CT, followed by Dr. Dalene Carrow. 8. Hyperlipidemia. 9. Dementia.    Review of Systems       ROS is negative except as outlined in HPI.   Vital Signs:  Patient profile:   77 year old female Height:      66 inches Weight:      164.50 pounds BMI:     26.65 Pulse rate:   85 / minute BP sitting:   102 / 57  (left arm) Cuff size:   regular  Vitals Entered By: Caralee Ates CMA (May 15, 2010 3:50 PM)  Physical Exam  Additional Exam:  Gen. Well-nourished, in no distress   Neck: No JVD, thyroid not enlarged, no carotid bruits Lungs: No tachypnea, clear without rales, rhonchi or wheezes Cardiovascular: Rhythm irregular, PMI not displaced,  heart sounds  normal, grade 2/6 systolic ejection murmur at the left sternal edge, no peripheral edema, pulses normal in all 4 extremities. Abdomen: BS normal, abdomen soft and non-tender without masses or organomegaly, no hepatosplenomegaly. MS: No deformities, no cyanosis or clubbing   Neuro:  No focal sns   Skin:  no lesions    Impression & Recommendations:  Problem # 1:  ATRIAL FIBRILLATION (ICD-427.31) She has recurrent atrial fibrillation. Apparently her rate control was not good but it appears good now on her current medications. The major issue is whether we should proceed with cardioversion  are settled for atrial fibrillation with a controlled rate. Apart from her left ventricular dysfunction she is not symptomatic with her atrial fibrillation. I think the left ventricular dysfunction may be related to poor rate control. My inclination is that with her severe dementia that leaving her in atrial fibrillation with a controlled rate is the best option.  The other issue is whether we should treat her with Coumadin. She was felt not to be a Coumadin candidate because of a previous history of a hematoma in her abdominal wall and because of her dementia. I think the hematoma is not an issue. Her wrist of stroke is very high and if her Coumadin can be regulated with the help of a home health nurse I think this would be desirable. We will try to do this.  I'll arrange for her to see Dr. Shirlee Latch back in followup in 4 weeks.  Her updated medication list for this problem includes:    Carvedilol 12.5 Mg Tabs (Carvedilol) .Marland Kitchen... Take one tablet by mouth twice a day    Aspirin 81 Mg Tbec (Aspirin) .Marland Kitchen... Take one tablet by mouth daily  Digoxin 0.125 Mg Tabs (Digoxin) ..... Once daily    Warfarin Sodium 5 Mg Tabs (Warfarin sodium) ..... Use as directed by anticoagulation clinic  Orders: TLB-BMP (Basic Metabolic Panel-BMET) (80048-METABOL) TLB-BNP (B-Natriuretic Peptide) (83880-BNPR) TLB-CBC Platelet - w/Differential (85025-CBCD) T-Digoxin (04540-98119) EKG w/ Interpretation (93000)  Her updated medication list for this problem includes:    Carvedilol 12.5 Mg Tabs (Carvedilol) .Marland Kitchen... Take one tablet by mouth twice a day    Aspirin 81 Mg Tbec (Aspirin) .Marland Kitchen... Take one tablet by mouth daily    Digoxin 0.125 Mg Tabs (Digoxin) ..... Once daily    Warfarin Sodium 5 Mg Tabs (Warfarin sodium) ..... Use as directed by anticoagulation clinic  Problem # 2:  CONGESTIVE HEART FAILURE (ICD-428.0) She has systolic CHF with an ejection fraction of 20%. She appears euvolemic today. I am hopeful that her fall in  ejection fraction from 40% a year and a half ago to 20% recently is related to her fast rates with atrial fibrillation. I am hopeful this will improve with better rate control. I think she would benefit from an ARB and will restart Diovan at a lower dose of 80 mg daily. The following medications were removed from the medication list:    Diovan 80 Mg Tabs (Valsartan) ..... One by mouth qd    Chlorthalidone 25 Mg Tabs (Chlorthalidone) .Marland Kitchen... Take 1 by mouth once daily Her updated medication list for this problem includes:    Carvedilol 12.5 Mg Tabs (Carvedilol) .Marland Kitchen... Take one tablet by mouth twice a day    Aspirin 81 Mg Tbec (Aspirin) .Marland Kitchen... Take one tablet by mouth daily    Digoxin 0.125 Mg Tabs (Digoxin) ..... Once daily    Furosemide 40 Mg Tabs (Furosemide) .Marland Kitchen... Take one tablet by mouth daily.    Warfarin Sodium 5 Mg Tabs (Warfarin sodium) ..... Use as directed by anticoagulation clinic    Diovan 80 Mg Tabs (Valsartan) .Marland Kitchen... Take one tablet by mouth daily  Orders: TLB-BMP (Basic Metabolic Panel-BMET) (80048-METABOL) TLB-BNP (B-Natriuretic Peptide) (83880-BNPR) TLB-CBC Platelet - w/Differential (85025-CBCD) T-Digoxin (14782-95621)  Problem # 3:  SENILE DEMENTIA, UNCOMPLICATED (ICD-290.0) She has severe dementia. She lives at home with home health nurse help. I stressed the importance of following a low sodium diet and careful compliance to medications and her daughter who was with her today said she thinks we can do that with the help of a home health nurses. Her daughter who is with her today lives in [place] and the other daughter lives in [place].  Patient Instructions: 1)  Labwork today: bmet/bnp/digoxin/cbc (308.65;784.69). 2)  Your physician recommends that you schedule a follow-up appointment in: 4 weeks with Dr. Shirlee Latch. 3)  Restart Diovan at 80mg   tablet once daily.

## 2010-06-28 NOTE — Letter (Signed)
Summary: Carolyne Fiscal MD  Carolyne Fiscal MD   Imported By: Lester Veneta 04/25/2010 11:55:37  _____________________________________________________________________  External Attachment:    Type:   Image     Comment:   External Document

## 2010-06-28 NOTE — Assessment & Plan Note (Signed)
Summary: PULM NODULES//kp   Primary Provider/Referring Provider:  Dr Duaine Dredge -Changed for 1 month now  CC:  Pulmonary Consult-Dr. Duaine Dredge.  History of Present Illness: April 17, 2010 77 yoF here with her daughter Misty Stanley at request of Dr Duaine Dredge, concerned about lung nodule. His good notes are available for review. . Never smoked but passive exposure when married to a smoker. Treated for breast cancer 1995  w/ bilateral lumpectomies and left XRT. Followed since by oncologist in [place], [place]. Serial Chest Ct has been following a lingular nodule described as 1.5x1.2 cm 08/27/05; 1.3x 1.1 07/07/06; 1.3 x 1.3 2/13/091.24 x 1.24 w/ indeterminate mild low FDG uptake on a PET scan 09/29/08. Lesion is described as in the inferior lingular segment, adjacent to the left heart border. No hilar or mediastinal nodes.  Care has been complicated by onset of dementia w/ unremarkable  MRI brain scan. PFT 2011 for dyspnea, reported severe obstrucive disease w/o response to bronchodilator.  Hx cardiomyopathy and PVCs treated by Dr Juanda Chance.  She notes dyspnea on exertion of walking about a block but says this hasn't changed over the summer. She denies cough, phlegm, wheeze or waking short of breath. She says Sherrie Mustache helps, but Dr Duaine Dredge thought she forgot to use it.   Preventive Screening-Counseling & Management  Alcohol-Tobacco     Smoking Status: never     Passive Smoke Exposure: yes  Comments: Passive smoker before husband died several years ago.  Current Medications (verified): 1)  Diovan 80 Mg Tabs (Valsartan) .... One By Mouth Qd 2)  Aricept 10 Mg  Tabs (Donepezil Hcl) .... Take 1 Tablet By Mouth Once A Day 3)  Simvastatin 20 Mg Tabs (Simvastatin) .... One By Mouth Qpm 4)  Synthroid 25 Mcg  Tabs (Levothyroxine Sodium) .... Take 1 Tablet By Mouth Once A Day 5)  Namenda Titration Pak  Tabs (Memantine Hcl) .... 5mg  Two Times A Day 6)  Citalopram Hydrobromide 20 Mg Tabs (Citalopram Hydrobromide) .... 1/2  By Mouth Once Daily X 1 Week, Then One By Mouth Qd 7)  Carvedilol 3.125 Mg Tabs (Carvedilol) .... Take One Tablet By Mouth Twice A Day 8)  Chlorthalidone 25 Mg Tabs (Chlorthalidone) .... Take 1 By Mouth Once Daily 9)  Arimidex 1 Mg Tabs (Anastrozole) .... Take 1 By Mouth Once Daily  Allergies (verified): 1)  ! Pcn  Past History:  Past Medical History: Last updated: 01/17/2009 Breast cancer, hx of  1995.  S/P lumpectomy and radiation (node, ER, PR negative) Hypertension Hypothyroidism  Elevated Glucose  Atrial fibrillation - off anticoagulation due to peri rectal hematoma Cirrhosis Dementia Renal insufficiency Congestive heart failure Hyperlipidemia Pulmonary Nodule - stable 02/08 LBBB  2. History of paroxysmal atrial fibrillation, now controlled on     amiodarone not coumadin candidate. 3. History of rate-related cardiomyopathy, now resolved with ejection     fraction of 40% 8/09. 4. History of systolic heart failure,  5. Mild aortic stenosis with mean aortic valve gradient of 10 mm by     last echo. 6. History of remote breast cancer with new breast mass 2009 7. Pulmonary nodule on CT, followed by Dr. Dalene Carrow. 8. Hyperlipidemia. 9. Dementia.    Family History: Last updated: 04/17/2010 Family hx of heart disease,cancer, and arthritis. 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  The patient denies coughing up blood, chest pain, acid heartburn, indigestion, loss of appetite, weight change, abdominal pain, difficulty swallowing, sore throat, tooth/dental problems, itching, ear ache, anxiety, depression, joint stiffness or pain, rash, change in color of mucus, and fever.    Vital Signs:  Patient profile:   77 year old female Height:      66 inches Weight:      170 pounds BMI:     27.54 O2 Sat:      99 % on Room air Pulse rate:   62 / minute BP sitting:   112 / 62  (left arm) Cuff size:   regular  Vitals Entered By: Reynaldo Minium CMA (April 17, 2010 2:44 PM)  O2 Flow:  Room air CC: Pulmonary Consult-Dr. Duaine Dredge   Physical Exam  Additional Exam:  General: A/Ox3; pleasant and cooperative, NAD, SKIN: no rash, lesions NODES: no lymphadenopathy HEENT: [place]/AT, EOM- WNL, Conjuctivae- clear, PERRLA, TM-WNL, diminished hearing, Nose- clear, Throat- clear and wnl, Mallampati  II NECK: Supple w/ fair ROM, JVD- none, normal carotid impulses w/o bruits Thyroid- normal to palpation CHEST: Clear to P&A, diminished, no wheeze or cough, rub or rales. HEART: Irregular/ Hx PVCs, no m/g/r heard ABDOMEN: Soft and nl; nml bowel sounds; no organomegaly or masses noted FAO:ZHYQ, nl pulses, trace edema  NEURO: Grossly intact to observation      Impression & Recommendations:  Problem # 1:  COPD (ICD-496) Spiriva is sufficient w/o affecting her heart rhythm. We will get a 6 MWT for dyspnea and stamina assessment.  Problem # 2:  LUNG NODULE (ICD-518.89) There has been little clear growth trend. Some variation is expected just from  techincal issues of imaging. The family said she was sent expecting  we would do a CXR today. We can do one to have in our system, but this won't have the measuring sensitivity of a CT. We discussed siginificance of this nodule which has changed very little. Family and patient seem to agree that they want only conservative therapy.   Orders: Consultation Level IV (99244) T-2 View CXR (71020TC)  Problem # 3:  CHRONIC RHINITIS (ICD-472.0)  Vaomotor rhinitis, bothersome to her especially with exertion and temprature changes. We will let her try ipratopium  Medications Added to Medication List This Visit: 1)  Chlorthalidone 25 Mg Tabs (Chlorthalidone) .... Take 1 by mouth once daily 2)  Arimidex 1 Mg Tabs (Anastrozole) .... Take 1 by mouth once daily 3)  Spiriva Handihaler 18 Mcg Caps (Tiotropium bromide monohydrate) .Marland Kitchen.. 1 daily 4)  Ipratropium Bromide 0.03 % Soln (Ipratropium bromide) .Marland Kitchen.. 1-2 puffs each nostril three times a day as needed runny nose.  Other Orders: Misc. Referral (Misc. Ref)  Patient Instructions: 1)  Please schedule a follow-up appointment in 1 month. 2)  I will review your reecords and make sure I am clear about the questions.  3)  We will schedule for a 6 minute walk test 4)  Try ipratropium  nasal spray to stop the watery nose.  Prescriptions: IPRATROPIUM BROMIDE 0.03 % SOLN (IPRATROPIUM BROMIDE) 1-2 puffs each nostril three times a day as needed runny nose.  #1 x prn   Entered and Authorized by:   Waymon Budge MD   Signed by:   Waymon Budge MD on 04/17/2010   Method used:   Print then Give to Patient   RxID:   [id number] SPIRIVA HANDIHALER 18 MCG CAPS (TIOTROPIUM BROMIDE MONOHYDRATE) 1 daily  #30 x prn   Entered and Authorized by:   Waymon Budge MD   Signed by:   Waymon Budge MD on 04/17/2010   Method used:   Historical   RxID:   [id number]

## 2010-06-28 NOTE — Medication Information (Signed)
Summary: new pt- Restart  Anticoagulant Therapy  Managed by: Bethena Midget, RN, BSN Referring MD: Juanda Chance MD, Bruce PCP: Dr Loura Back MD: Daleen Squibb MD, Maisie Fus Indication 1: Atrial Fibrillation Lab Used: [facility] [place] Site: [address] INR POC 2.2 INR RANGE 2.0-3.0  Dietary changes: no    Health status changes: no    Bleeding/hemorrhagic complications: no    Recent/future hospitalizations: no    Any changes in medication regimen? no    Recent/future dental: no  Any missed doses?: no       Is patient compliant with meds? yes      Comments: Pt previously on coumadin in past but discontinued per Dr Juanda Chance nurse due to GI Bleed. Re-educated on warfarin.   Allergies: 1)  ! Pcn  Anticoagulation Management History:      The patient comes in today for her initial visit for anticoagulation therapy.  Positive risk factors for bleeding include an age of 14 years or older.  The bleeding index is 'intermediate risk'.  Positive CHADS2 values include History of CHF, History of HTN, and Age > 20 years old.  Anticoagulation responsible provider: Daleen Squibb MD, Maisie Fus.  INR POC: 2.2.  Cuvette Lot#: [id number].  Exp: 05/2011.    Anticoagulation Management Assessment/Plan:      The patient's current anticoagulation dose is Warfarin sodium 5 mg tabs: Use as directed by Anticoagulation Clinic.  The target INR is 2.0-3.0.  The next INR is due 05/22/2010.  Anticoagulation instructions were given to patient/daughter.  Results were reviewed/authorized by Bethena Midget, RN, BSN.  She was notified by Bethena Midget, RN, BSN.         Current Anticoagulation Instructions: INR 2.2  Spoke with pt's daughter.  Continue same dose of 2 tablets every day.

## 2010-06-28 NOTE — Assessment & Plan Note (Signed)
Summary: ec6  Medications Added KLOR-CON M20 20 MEQ CR-TABS (POTASSIUM CHLORIDE CRYS CR) 1 by mouth daily DIOVAN 40 MG TABS (VALSARTAN) Take one tablet by mouth two times a day CEREFOLIN NAC 6-2-600 MG TABS (METHYLFOL-METHYLCOB-ACETYLCYST) 1 by mouth daily SPIRONOLACTONE 25 MG TABS (SPIRONOLACTONE) Take 1/2  tablet by mouth daily      Allergies Added:   Primary Provider:  Dr Duaine Dredge    History of Present Illness: 77 yo with history of dementia, atrial fibrillation, and possible tachycardia-mediated cardiomyopathy returns for office followup.  She has had a complicated course recently.  She went back into atrial fibrillation with rapid ventricular response in 12/11 and was hospitalized in [place].  Myoview showed no ischemia.  Echo showed EF 20% with global hypokinesis.  She was diuresed and rate was controlled.  She was started on coumadin.  No bleeding complcations since starting coumadin.    Patient has significant trouble with memory and is accompanied by her daughter who give much of the history.  On Lasix and meds for heart failure, she has become symptomatically better.  HR is now in the 70s (still atrial fibrillation) on Coreg 12.5 two times a day.  She is fatigued in general with lack of energy. She went 193 m in a recent 6 minute walk with pulmonary.  She did not drop her oxygen saturation but she did have to stop several times during the walk.  No chest pain.  No lightheadedness or syncope, but patient's daughter says that BP sometimes runs low at home (down to systolic in upper 80s before).  She has lost 4.5 lbs since last appointment.    ECG: atrial fibrillation with LBBB, rate 77  Current Medications (verified): 1)  Aricept 10 Mg  Tabs (Donepezil Hcl) .... Take 1 Tablet By Mouth Once A Day 2)  Simvastatin 20 Mg Tabs (Simvastatin) .... Take One Tablet By Mouth Daily At Bedtime 3)  Synthroid 25 Mcg  Tabs (Levothyroxine Sodium) .... Take 1 Tablet By Mouth Once A Day 4)  Namenda  Titration Pak  Tabs (Memantine Hcl) .... 5mg  Once Daily 5)  Carvedilol 12.5 Mg Tabs (Carvedilol) .... Take One Tablet By Mouth Twice A Day 6)  Spiriva Handihaler 18 Mcg Caps (Tiotropium Bromide Monohydrate) .Marland Kitchen.. 1 Daily 7)  Aspirin 81 Mg Tbec (Aspirin) .... Take One Tablet By Mouth Daily 8)  Anastrozole 1 Mg Tabs (Anastrozole) .... Once Daily 9)  Digoxin 0.125 Mg Tabs (Digoxin) .... Once Daily 10)  Furosemide 40 Mg Tabs (Furosemide) .... Take One Tablet By Mouth Daily. 11)  Klor-Con M20 20 Meq Cr-Tabs (Potassium Chloride Crys Cr) .Marland Kitchen.. 1 By Mouth Daily 12)  Warfarin Sodium 2.5 Mg Tabs (Warfarin Sodium) .... Use As Directed By Anticoagualtion Clinic 13)  Diovan 80 Mg Tabs (Valsartan) .... Take One Tablet By Mouth Daily 14)  Celexa 20 Mg Tabs (Citalopram Hydrobromide) .... Take One Tab By Mouth Once Daily 15)  Cerefolin Nac 6-2-600 Mg Tabs (Methylfol-Methylcob-Acetylcyst) .Marland Kitchen.. 1 By Mouth Daily  Allergies (verified): 1)  ! Pcn  Past History:  Past Medical History: 1. Breast cancer, hx of  1995.  S/P lumpectomy and radiation (node, ER, PR negative). 2. Hypertension 3. Hypothyroidism  4. 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  Patient was cardioverted and maintained on amiodarone, and EF improved to 40%.  Patient stopped amiodarone because of anorexia and stopped coumadin due to rectus sheath hematoma.  Atrial fib with RVR recurred in 12/11.  EF down to 20% on echo.  Patient was rate-controlled and coumadin was restarted.   5. Moderate dementia 6. CKD: mild 7. Cardiomyopathy: Possibly tachycardia-mediated.  In 4/07 in setting of atrial fibrillation with RVR, EF was 30%.  After conversion to NSR, EF 40% in 8/09.  In 12/11, noted to be back in atrial fibrillation with RVR.  Echo (12/11) with EF 20%, severe global hypokinesis, mild to moderate MR, PA systolic pressure 39 mmHg, mild to moderate AS with mean gradient of 13 (may be low gradient  AS in the setting of decreased cardiac output).   Myoview in 4/07 was negative for ischemia or infarction.  Myoview (12/11) showed small fixed apical defect likely due to apical thinning.  No ischemia.   7. Hyperlipidemia 8. Pulmonary Nodule - stable 02/08 9. LBBB 10. Mild to moderate aortic stenosis with mean aortic valve gradient of 13 mm by last echo but may be low gradient aortic stenosis in the setting of low cardiac output.   Family History: Reviewed history from 04/17/2010 and no changes required. Family hx of heart disease,cancer, and arthritis. Brother- pancreatic cancer  Social History: Never Smoked Lives by herself but someone checks in twice a day.  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   Vital Signs:  Patient profile:   77 year old female Height:      66 inches Weight:      160 pounds BMI:     25.92 Pulse rate:   88 / minute Resp:     16 per minute BP sitting:   104 / 64  (left arm)  Vitals Entered By: Sherri Rad, RN, BSN (June 12, 2010 11:16 AM)  Physical Exam  General:  Elderly woman in no distress.  Neck:  Neck supple, no JVD. No masses, thyromegaly or abnormal cervical nodes. Lungs:  Clear bilaterally to auscultation and percussion. Heart:  Lateral PMI, chest non-tender; irregular rate and rhythm, S1, S2 without rubs or gallops. 3/6 systolic murmur RUSB, S2 is heard clearly.  Carotid upstroke normal, no bruit. Pedals normal pulses. No edema, no varicosities. Abdomen:  Bowel sounds positive; abdomen soft and non-tender without masses, organomegaly, or hernias noted. No hepatosplenomegaly. Extremities:  No clubbing or cyanosis. Neurologic:  Poor short-term memory.  Psych:  Normal affect.   Impression & Recommendations:  Problem # 1:  ATRIAL FIBRILLATION  (ICD-427.31) Patient remains in atrial fibrillation but rate is under good control (70s here, 70s per daughter at home).  She is tolerating coumadin without any signs of bleeding.  She is steady on ner feet with no falls.  Her cardiomyopathy may be tachycardia-mediated.  HR is now under control.  I think that it would be reasonable to continue this strategy for now.  Hopefully control of rate alone will improve LV function.  If repeat echo shows continued severely reduced systolic function, we can discuss cardioversion.  However, I suspect that she will not be able to hold NSR after cardioversion without amiodarone and she was intolerant of amiodarone in the past (appears to have caused poor appetite, nausea).    Problem # 2:  CONGESTIVE HEART FAILURE (ICD-428.0) Chronic systolic heart failure.  EF 20% on 12/11 echo.  NYHA class III symptoms though improving.  Weight is down 4.5 lbs on Lasix.  Cardiomyopathy may be tachycardia-mediated.  In which case, LV function  would be expected to improve with rate control.  HR is now in the 70s.  Myoview in 12/11 showed no ischemia or infarction.   - Continue current Lasix dose.  Patient is losing weight and volume seems better.  - Continue digoxin but check level.  - Divide valsartan to 40 mg two times a day - Add spironolactone 12.5 mg daily.  - Continue current Coreg - BMET/BNP now and in 2 wks.   - Repeat echo and office appointment in 1 month.   Problem # 3:  MURMUR Aortic stenosis, mild to moderate by echo.   Other Orders: EKG w/ Interpretation (93000) Echocardiogram (Echo) TLB-BMP (Basic Metabolic Panel-BMET) (80048-METABOL) TLB-BNP (B-Natriuretic Peptide) (83880-BNPR)  Patient Instructions: 1)  Take Diovan 40mg  two times a day. 2)  Start Spironalactone 25mg  1/2 tablet once daily. 3)  Labwork today: bmet/bnp (161.09;604.54). 4)  Labwork in 2 weeks: bmet (427.31;428.22). 5)  Your physician has requested that you have an echocardiogram in 1  month- this can be done the same day you see Dr. Shirlee Latch.  Echocardiography is a painless test that uses sound waves to create images of your heart. It provides your doctor with information about the size and shape of your heart and how well your heart's chambers and valves are working.  This procedure takes approximately one hour. There are no restrictions for this procedure. 6)  Your physician recommends that you schedule a follow-up appointment in: 1 month. Prescriptions: SPIRONOLACTONE 25 MG TABS (SPIRONOLACTONE) Take 1/2  tablet by mouth daily  #30 x 6   Entered by:   Sherri Rad, RN, BSN   Authorized by:   Marca Ancona, MD   Signed by:   Sherri Rad, RN, BSN on 06/12/2010   Method used:   Electronically to        [facility]. [phone]* (retail)       [address]       [place], [place]  [address]       Ph: [phone]       Fax: [phone]   RxID:   [id number]

## 2010-06-28 NOTE — Progress Notes (Signed)
Summary: pt daughter has a couple of questions   Phone Note Call from Patient Call back at Home Phone [phone] Call back at [phone]   Caller: Daughter/Lisa Bynum Reason for Call: Talk to Nurse, Talk to Doctor Summary of Call: pt daughter was wondering with her heart muscles weakening dose pt need to have a echo to determine if meds are working if she dose can it be done the sameday. The records that where sent to our office from [facility] can we send them to her PCP. Initial call taken by: Omer Jack,  May 29, 2010 12:21 PM  Follow-up for Phone Call        I spoke with the pt's daughter and made her aware that Dr Shirlee Latch would have to review the pt's chart before deciding if the pt needs a repeat echo at her upcoming appt on 06/12/09. (The pt was only off of Diovan for one week while hospitalized.)  The pt's daughter has to travel from [place] for appointments.  She would also like the pt's hospital records sent to Dr Duaine Dredge.  I will fax these records per her request. I will forward this message to Dr Shirlee Latch to review the pt's chart and decide if echo is needed in a few weeks.    Follow-up by: Julieta Gutting, RN, BSN,  May 29, 2010 12:59 PM     Appended Document: pt daughter has a couple of questions echo done 05/07/10 at [facility]--LM for daughter, Misty Stanley to call back   Appended Document: pt daughter has a couple of questions I talked with daughter--she will come with pt to appt with Dr Shirlee Latch 06/12/10

## 2010-06-28 NOTE — Letter (Signed)
Summary: [facility]   Imported By: Marylou Mccoy 05/29/2010 11:23:50  _____________________________________________________________________  External Attachment:    Type:   Image     Comment:   External Document  Appended Document: [facility] Pt to f/u with Dr. Shirlee Latch.

## 2010-06-28 NOTE — Assessment & Plan Note (Signed)
Summary: SIX MIN WALK-PULM STRESS TEST   Nurse Visit   Vital Signs:  Patient profile:   77 year old female Pulse rate:   59 / minute BP sitting:   118 / 58  Medications Prior to Update: 1)  Aricept 10 Mg  Tabs (Donepezil Hcl) .... Take 1 Tablet By Mouth Once A Day 2)  Simvastatin 20 Mg Tabs (Simvastatin) .... Take One Tablet By Mouth Daily At Bedtime 3)  Synthroid 25 Mcg  Tabs (Levothyroxine Sodium) .... Take 1 Tablet By Mouth Once A Day 4)  Namenda Titration Pak  Tabs (Memantine Hcl) .... 5mg  Once Daily 5)  Carvedilol 12.5 Mg Tabs (Carvedilol) .... Take One Tablet By Mouth Twice A Day 6)  Spiriva Handihaler 18 Mcg Caps (Tiotropium Bromide Monohydrate) .Marland Kitchen.. 1 Daily 7)  Aspirin 81 Mg Tbec (Aspirin) .... Take One Tablet By Mouth Daily 8)  Cerefolin Nac 6-2-600 Mg Tabs (Methylfol-Methylcob-Acetylcyst) .... Once Daily 9)  Anastrozole 1 Mg Tabs (Anastrozole) .... Once Daily 10)  Digoxin 0.125 Mg Tabs (Digoxin) .... Once Daily 11)  Furosemide 40 Mg Tabs (Furosemide) .... Take One Tablet By Mouth Daily. 12)  Potassium Chloride Cr 10 Meq Cr-Caps (Potassium Chloride) .... Take One Tablet By Mouth Once Daily 13)  Warfarin Sodium 2.5 Mg Tabs (Warfarin Sodium) .... Use As Directed By Anticoagualtion Clinic 14)  Diovan 80 Mg Tabs (Valsartan) .... Take One Tablet By Mouth Daily 15)  Celexa 20 Mg Tabs (Citalopram Hydrobromide) .... Take One Tab By Mouth Once Daily  Allergies: 1)  ! Pcn  Orders Added: 1)  Pulmonary Stress (6 min walk) [94620]   Six Minute Walk Test Medications taken before test(dose and time): 1)  Aricept 10 Mg  Tabs (Donepezil Hcl) .... Take 1 Tablet By Mouth Once A Day 3)  Synthroid 25 Mcg  Tabs (Levothyroxine Sodium) .... Take 1 Tablet By Mouth Once A Day 4)  Namenda Titration Pak  Tabs (Memantine Hcl) .... 5mg  Once Daily 5)  Carvedilol 12.5 Mg Tabs (Carvedilol) .... Take One Tablet By Mouth Twice A Day 7)  Aspirin 81 Mg Tbec (Aspirin) .... Take One Tablet By Mouth  Daily 8)  Cerefolin Nac 6-2-600 Mg Tabs (Methylfol-Methylcob-Acetylcyst) .... Once Daily 9)  Anastrozole 1 Mg Tabs (Anastrozole) .... Once Daily 10)  Digoxin 0.125 Mg Tabs (Digoxin) .... Once Daily 11)  Furosemide 40 Mg Tabs (Furosemide) .... Take One Tablet By Mouth Daily. 12)  Potassium Chloride Cr 10 Meq Cr-Caps (Potassium Chloride) .... Take One Tablet By Mouth Once Daily 13)  Warfarin Sodium 2.5 Mg Tabs (Warfarin Sodium) .... Use As Directed By Anticoagualtion Clinic 14)  Diovan 80 Mg Tabs (Valsartan) .... Take One Tablet By Mouth Daily 15)  Celexa 20 Mg Tabs (Citalopram Hydrobromide) .... Take One Tab By Mouth Once Daily Pt took all am meds at 7:00am today Supplemental oxygen during the test: No  Lap counter(place a tick mark inside a square for each lap completed) lap 1 complete  lap 2 complete   lap 3 complete   lap 4 complete   Baseline  BP sitting: 118/ 58 Heart rate: 59 Dyspnea ( Borg scale) 1 Fatigue (Borg scale) 0 SPO2 95  End Of Test  BP sitting: 122/ 62 Heart rate: 76 Dyspnea ( Borg scale) 1 Fatigue (Borg scale) 0 SPO2 94  2 Minutes post  BP sitting: 120/ 60 Heart rate: 65 SPO2 95  Stopped or paused before six minutes? Yes Reason: pt paused twice for as total of 2.5 mind- SOB  Interpretation: Number of laps  4 X 48 meters =   192 meters =    192 meters   Total distance walked in six minutes: 192 meters  Tech ID: Tivis Ringer, CNA (May 29, 2010 3:10 PM) Tech Comments Pt completed test w/ 2 rest breaks and 1 complaint: SOB.

## 2010-06-28 NOTE — Progress Notes (Signed)
   Walk in Patient Form Recieved " Patient left Records from Dr.Blomgren office" forwarded to [facility] for her to flag what she need.sign Erle Crocker  June 01, 2009 10:49 AM  .

## 2010-06-28 NOTE — Assessment & Plan Note (Signed)
Summary: ROV 1 MONTH ///KP   Primary Provider/Referring Provider:  Dr Duaine Dredge   CC:  1 month follow up and review test..  History of Present Illness: History of Present Illness: April 17, 2010 84 yoF here with her daughter Misty Stanley at request of Dr Duaine Dredge, concerned about lung nodule. His good notes are available for review. . Never smoked but passive exposure when married to a smoker. Treated for breast cancer 1995  w/ bilateral lumpectomies and left XRT. Followed since by oncologist in [place], [place]. Serial Chest Ct has been following a lingular nodule described as 1.5x1.2 cm 08/27/05; 1.3x 1.1 07/07/06; 1.3 x 1.3 2/13/091.24 x 1.24 w/ indeterminate mild low FDG uptake on a PET scan 09/29/08. Lesion is described as in the inferior lingular segment, adjacent to the left heart border. No hilar or mediastinal nodes.  Care has been complicated by onset of dementia w/ unremarkable  MRI brain scan. PFT 2011 for dyspnea, reported severe obstrucive disease w/o response to bronchodilator.  Hx cardiomyopathy and PVCs treated by Dr Juanda Chance.  She notes dyspnea on exertion of walking about a block but says this hasn't changed over the summer. She denies cough, phlegm, wheeze or waking short of breath. She says Sherrie Mustache helps, but Dr Duaine Dredge thought she forgot to use it.   May 29, 2010- Lung nodule, hx breast Ca/ XRT, Dyspnea, COPD, AFib, diastolic CHF/ EF 91%...dtr here. Hosp in December for dyspnea related to her heart rhthym, treated medically.  6 MWT - 95%, 94%, 92% 192 meters, stopped for dyspne CXR- stable lingular nodule, enlarged heart w/ evidence of PHTN (secondary). Lung nodule hadn't definitiely grown on review back to 2007, with latest CT in 2010.         Preventive Screening-Counseling & Management  Alcohol-Tobacco     Smoking Status: never     Passive Smoke Exposure: yes  Current Medications (verified): 1)  Aricept 10 Mg  Tabs (Donepezil Hcl) .... Take 1 Tablet By Mouth Once A  Day 2)  Simvastatin 20 Mg Tabs (Simvastatin) .... Take One Tablet By Mouth Daily At Bedtime 3)  Synthroid 25 Mcg  Tabs (Levothyroxine Sodium) .... Take 1 Tablet By Mouth Once A Day 4)  Namenda Titration Pak  Tabs (Memantine Hcl) .... 5mg  Once Daily 5)  Carvedilol 12.5 Mg Tabs (Carvedilol) .... Take One Tablet By Mouth Twice A Day 6)  Spiriva Handihaler 18 Mcg Caps (Tiotropium Bromide Monohydrate) .Marland Kitchen.. 1 Daily 7)  Aspirin 81 Mg Tbec (Aspirin) .... Take One Tablet By Mouth Daily 8)  Anastrozole 1 Mg Tabs (Anastrozole) .... Once Daily 9)  Digoxin 0.125 Mg Tabs (Digoxin) .... Once Daily 10)  Furosemide 40 Mg Tabs (Furosemide) .... Take One Tablet By Mouth Daily. 11)  Potassium Chloride Cr 10 Meq Cr-Caps (Potassium Chloride) .... Take One Tablet By Mouth Once Daily 12)  Warfarin Sodium 2.5 Mg Tabs (Warfarin Sodium) .... Use As Directed By Anticoagualtion Clinic 13)  Diovan 80 Mg Tabs (Valsartan) .... Take One Tablet By Mouth Daily 14)  Celexa 20 Mg Tabs (Citalopram Hydrobromide) .... Take One Tab By Mouth Once Daily  Allergies (verified): 1)  ! Pcn  Past History:  Past Medical History: Last updated: 01/17/2009 Breast cancer, hx of  1995.  S/P lumpectomy and radiation (node, ER, PR negative) Hypertension Hypothyroidism  Elevated Glucose  Atrial fibrillation - off anticoagulation due to peri rectal hematoma Cirrhosis Dementia Renal insufficiency Congestive heart failure Hyperlipidemia Pulmonary Nodule - stable 02/08 LBBB  2. History of paroxysmal atrial  fibrillation, now controlled on     amiodarone not coumadin candidate. 3. History of rate-related cardiomyopathy, now resolved with ejection     fraction of 40% 8/09. 4. History of systolic heart failure,  5. Mild aortic stenosis with mean aortic valve gradient of 10 mm by     last echo. 6. History of remote breast cancer with new breast mass 2009 7. Pulmonary nodule on CT, followed by Dr. Dalene Carrow. 8. Hyperlipidemia. 9. Dementia.     Past Surgical History: Last updated: 04/17/2010 Bilateral lumpectomy breast cancer- XRT Thyroid  Family History: Last updated: 04/17/2010 Family hx of heart disease,cancer, and arthritis. 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  The patient denies shortness of breath at rest, productive cough, non-productive cough, coughing up blood, chest pain, acid heartburn, indigestion, loss of appetite, weight change, abdominal pain, difficulty swallowing, sore throat, tooth/dental problems, headaches, nasal congestion/difficulty breathing through nose, and sneezing.    Vital Signs:  Patient profile:   77 year old female Height:      66 inches Weight:      146 pounds O2 Sat:      94 % on Room air Pulse rate:   74 / minute BP sitting:   114 / 68  (left arm) Cuff size:   regular  Vitals Entered By: Reynaldo Minium CMA (May 29, 2010 3:04 PM)  O2 Flow:  Room air CC: 1 month follow up and review test.   Physical Exam  Additional Exam:  General: A/Ox3; pleasant and cooperative, NAD, calm and interactive SKIN: no rash, lesions NODES: no lymphadenopathy HEENT: [place]/AT, EOM- WNL, Conjuctivae- clear, PERRLA, TM-WNL, diminished hearing, Nose- clear, Throat- clear and wnl, Mallampati  II NECK: Supple w/ fair ROM, JVD- none, normal carotid impulses w/o bruits Thyroid- normal to palpation CHEST: Clear to P&A, diminished, no wheeze or cough, rub or rales. HEART: Irregular/ Hx PVCs, no m/g/r heard ABDOMEN: not obese ZOX:WRUE, nl pulses, 2 + edema NEURO: Grossly intact to observation      Impression &  Recommendations:  Problem # 1:  LUNG NODULE (ICD-518.89)  This nodule appears to be stable.   Problem # 2:  DYSPNEA (ICD-786.05)  She has some airway slowing, partly reflecting weakness. Most of her dyspnea is cardiogenic, indicated by preserved O2 sats during exercise, lack of cough and wheeze, and her known CHF. Note definite edema today. She denied swelling or fluid retention and expressed surprise when I demonstrated obvious pretibial edema We will checlk ONOX because of the radilology evidence of pulmonary hypertension.   Problem # 3:  CONGESTIVE HEART FAILURE (ICD-428.0) She doesn't understand the significance of her medical problems with any real insight- perhaps due to her reported dementia. I think this affects her ability to witness and report changes.  Her updated medication list for this problem includes:    Carvedilol 12.5 Mg Tabs (Carvedilol) .Marland Kitchen... Take one tablet by mouth twice a day    Aspirin 81 Mg Tbec (Aspirin) .Marland Kitchen... Take one tablet by mouth daily    Digoxin 0.125 Mg Tabs (Digoxin) ..... Once daily    Furosemide 40 Mg Tabs (Furosemide) .Marland Kitchen... Take one tablet  by mouth daily.    Warfarin Sodium 2.5 Mg Tabs (Warfarin sodium) ..... Use as directed by anticoagualtion clinic    Diovan 80 Mg Tabs (Valsartan) .Marland Kitchen... Take one tablet by mouth daily  Other Orders: Est. Patient Level IV (40981) DME Referral (DME)  Patient Instructions: 1)  Please schedule a follow-up appointment in 1 month. 2)  See [facility] to arrange overnight oximetry 3)  cc Dr Duaine Dredge

## 2010-06-28 NOTE — Medication Information (Signed)
Summary: rov/tm  Anticoagulant Therapy  Managed by: Cloyde Reams, RN, BSN Referring MD: Shirlee Latch MD, Freida Busman PCP: Dr Duaine Dredge  Supervising MD: Riley Kill MD, Maisie Fus Indication 1: Atrial Fibrillation Lab Used: [facility] [place] Site: [address] INR POC 1.7 INR RANGE 2.0-3.0  Dietary changes: no    Health status changes: no    Bleeding/hemorrhagic complications: no    Recent/future hospitalizations: no    Any changes in medication regimen? yes       Details: Pt saw Dr Shirlee Latch, started on Sipronalactone today.  Recent/future dental: no  Any missed doses?: no       Is patient compliant with meds? yes       Allergies: 1)  ! Pcn  Anticoagulation Management History:      The patient is taking warfarin and comes in today for a routine follow up visit.  Positive risk factors for bleeding include an age of 37 years or older.  The bleeding index is 'intermediate risk'.  Positive CHADS2 values include History of CHF, History of HTN, and Age > 80 years old.  Anticoagulation responsible provider: Riley Kill MD, Maisie Fus.  INR POC: 1.7.  Cuvette Lot#: [id number].  Exp: 06/2011.    Anticoagulation Management Assessment/Plan:      The patient's current anticoagulation dose is Warfarin sodium 2.5 mg tabs: Use as directed by Anticoagualtion Clinic.  The target INR is 2.0-3.0.  The next INR is due 06/26/2010.  Anticoagulation instructions were given to patient/daughter.  Results were reviewed/authorized by Cloyde Reams, RN, BSN.  She was notified by Cloyde Reams RN.         Prior Anticoagulation Instructions: INR 1.6 Today take 3 pills then change dose to 2 pills everyday except  on Sundays take 3 pills. Recheck in 2 weeks.   Current Anticoagulation Instructions: INR 1.7  Take an extra 1 tablet today, then start taking 2 tablets daily except 3 tablets on Thursdays and Sundays.  Recheck in 2 weeks.

## 2010-07-04 NOTE — Assessment & Plan Note (Signed)
Summary: rov 1 month///kp   Primary Provider/Referring Provider:  Dr Duaine Dredge   CC:  1 month follow up visit-Dyspnea.  History of Present Illness: May 29, 2010- Lung nodule, hx breast Ca/ XRT, Dyspnea, COPD, AFib, diastolic CHF/ EF 34%...dtr here. Hosp in December for dyspnea related to her heart rhthym, treated medically.  6 MWT - 95%, 94%, 92% 192 meters, stopped for dyspne CXR- stable lingular nodule, enlarged heart w/ evidence of PHTN (secondary). Lung nodule hadn't definitiely grown on review back to 2007, with latest CT in 2010.   June 26, 2010-  Lung nodule, hx breast Ca/ XRT, Dyspnea, COPD, PHTN, AFib, diastolic CHF/ EF 74%...dtr here. Nurse-CC: 1 month follow up visit-Dyspnea We ordered O2 for sleep based on ONOX with hx pulmonary hypertension. O2 was delivered with no instruction, by [facility]. We went through oxygen discussion, up to and including how to wear the nasal prongs.  She has not had cough or respiratory infection since last here.        Preventive Screening-Counseling & Management  Alcohol-Tobacco     Smoking Status: never     Passive Smoke Exposure: yes  Current Medications (verified): 1)  Aricept 10 Mg  Tabs (Donepezil Hcl) .... Take 1 Tablet By Mouth Once A Day 2)  Simvastatin 20 Mg Tabs (Simvastatin) .... Take One Tablet By Mouth Daily At Bedtime 3)  Synthroid 25 Mcg  Tabs (Levothyroxine Sodium) .... Take 1 Tablet By Mouth Once A Day 4)  Namenda Titration Pak  Tabs (Memantine Hcl) .... 5mg  Once Daily 5)  Carvedilol 12.5 Mg Tabs (Carvedilol) .... Take One Tablet By Mouth Twice A Day 6)  Spiriva Handihaler 18 Mcg Caps (Tiotropium Bromide Monohydrate) .Marland Kitchen.. 1 Daily 7)  Aspirin 81 Mg Tbec (Aspirin) .... Take One Tablet By Mouth Daily 8)  Anastrozole 1 Mg Tabs (Anastrozole) .... Once Daily 9)  Digoxin 0.125 Mg Tabs (Digoxin) .... Once Daily 10)  Furosemide 40 Mg Tabs (Furosemide) .... Take One Tablet By Mouth Daily. 11)  Klor-Con M20 20 Meq Cr-Tabs  (Potassium Chloride Crys Cr) .Marland Kitchen.. 1 By Mouth Daily 12)  Warfarin Sodium 2.5 Mg Tabs (Warfarin Sodium) .... Use As Directed By Anticoagualtion Clinic 13)  Diovan 40 Mg Tabs (Valsartan) .... Take 1/2 Tablet By Mouth Two Times A Day 14)  Celexa 20 Mg Tabs (Citalopram Hydrobromide) .... Take One Tab By Mouth Once Daily 15)  Cerefolin Nac 6-2-600 Mg Tabs (Methylfol-Methylcob-Acetylcyst) .Marland Kitchen.. 1 By Mouth Daily 16)  Spironolactone 25 Mg Tabs (Spironolactone) .... Take 1/2  Tablet By Mouth Daily 17)  O2 2 L/m During Sleep  Allergies (verified): 1)  ! Pcn  Past History:  Past Medical History: Last updated: 06/12/2010 1. Breast cancer, hx of  1995.  S/P lumpectomy and radiation (node, ER, PR negative). 2. Hypertension 3. Hypothyroidism  4. 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  Patient was cardioverted and maintained on amiodarone, and EF improved to 40%.  Patient stopped amiodarone because of anorexia and stopped coumadin due to rectus sheath hematoma.  Atrial fib with RVR recurred in 12/11.  EF down to 20% on echo.  Patient was rate-controlled and coumadin was restarted.   5. Moderate dementia 6. CKD: mild 7. Cardiomyopathy: Possibly tachycardia-mediated.  In 4/07 in setting of atrial fibrillation with RVR, EF was 30%.  After conversion to NSR, EF 40% in 8/09.  In 12/11, noted to be back in atrial fibrillation with RVR.  Echo (12/11) with EF 20%, severe global hypokinesis, mild to  moderate MR, PA systolic pressure 39 mmHg, mild to moderate AS with mean gradient of 13 (may be low gradient AS in the setting of decreased cardiac output).   Myoview in 4/07 was negative for ischemia or infarction.  Myoview (12/11) showed small fixed apical defect likely due to apical thinning.  No ischemia.   7. Hyperlipidemia 8. Pulmonary Nodule - stable 02/08 9. LBBB 10. Mild to moderate aortic stenosis with mean aortic valve gradient of 13 mm by last echo but  may be low gradient aortic stenosis in the setting of low cardiac output.   Past Surgical History: Last updated: 04/17/2010 Bilateral lumpectomy breast cancer- XRT Thyroid  Family History: Last updated: 04/17/2010 Family hx of heart disease,cancer, and arthritis. Brother- pancreatic cancer  Social History: Last updated: 06/12/2010 Never Smoked Lives by herself but someone checks in twice a day.  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    Vital Signs:  Patient profile:   77 year old female Height:      66 inches Weight:      158.50 pounds O2 Sat:      95 % on Room air Pulse rate:   74 / minute BP sitting:   90 / 60  (left arm) Cuff size:   regular  O2 Flow:  Room air  Physical Exam  Additional Exam:  General: A/Ox3; pleasant and cooperative, NAD, calm and interactive SKIN: no rash, lesions NODES: no lymphadenopathy HEENT: [place]/AT, EOM- WNL, Conjuctivae- clear, PERRLA, TM-WNL, diminished hearing, Nose- clear, Throat- clear and wnl, Mallampati  II NECK: Supple w/ fair ROM, JVD- none, normal carotid impulses w/o bruits Thyroid- normal to palpation CHEST: Clear to P&A, diminished, no wheeze or cough, rub or rales. HEART: Irregular/ Hx PVCs, no m/g/r heard ABDOMEN: not obese ZOX:WRUE, nl pulses, 2 + edema NEURO: Grossly intact to observation      Impression &  Recommendations:  Problem # 1:  COPD (ICD-496) We have added and discussed oxygen for sleep at 2 L/M with hope it will take a load off her heart. If it improves her menatation any that would be a bonus. Oxygenation is good while awake- 955% on room air as she came in today.   Problem # 2:  LUNG NODULE (ICD-518.89) Stable as of November CXR, being followed conservatively. These are unlikely to require active attention and I think she and her daughter are comfortable with inattention.   Problem # 3:  COUGH (ICD-786.2) Cough is now incidental with little distress. It may provew labile with season changes.   Medications Added to Medication List This Visit: 1)  Diovan 40 Mg Tabs (Valsartan) .... Take 1/2 tablet by mouth two times a day  Other Orders: Est. Patient Level IV (45409)  Patient Instructions: 1)  Please schedule a follow-up appointment in 6 months. 2)  Wear oxygen while sleeping every night at 2 L/M. 3)  Please call if any problems 4)  Continue Spiriva once daily 5)  cc Dr Duaine Dredge  Immunization History:  Influenza Immunization History:    Influenza:  historical (02/04/2010)

## 2010-07-04 NOTE — Progress Notes (Signed)
Summary: Add O2 for sleep 2 L/M based on ONOX 06/09/10  Phone Note Other Incoming   Summary of Call: ONOX on room air 06/09/10 indicartes 14.5 minutes with sat < 88%, meeting guidelines for O2. We ordered the study because of pulmonary hypertension, so I will recommend adding home O2 during sleep.  Initial call taken by: Waymon Budge MD,  June 24, 2010 10:11 PM    New/Updated Medications: * O2 2 L/M DURING SLEEP   Appended Document: Add O2 for sleep 2 L/M based on ONOX 06/09/10 Pt contacted about ono and she is aware that [facility] will contact her about arrangements of getting her set up with a concentrator. Pt to use 2 lpm during sleep.

## 2010-07-04 NOTE — Procedures (Signed)
Summary: Oximetry/[employer]  Oximetry/[employer]   Imported By: Lester Ladonia 06/28/2010 09:13:34  _____________________________________________________________________  External Attachment:    Type:   Image     Comment:   External Document

## 2010-07-06 ENCOUNTER — Encounter: Payer: Self-pay | Admitting: Internal Medicine

## 2010-07-17 DIAGNOSIS — I4891 Unspecified atrial fibrillation: Secondary | ICD-10-CM

## 2010-07-20 ENCOUNTER — Other Ambulatory Visit: Payer: Self-pay | Admitting: Cardiology

## 2010-07-20 ENCOUNTER — Encounter: Payer: Self-pay | Admitting: Internal Medicine

## 2010-07-20 ENCOUNTER — Ambulatory Visit (INDEPENDENT_AMBULATORY_CARE_PROVIDER_SITE_OTHER): Payer: Medicare Other | Admitting: Cardiology

## 2010-07-20 ENCOUNTER — Encounter: Payer: Self-pay | Admitting: Cardiology

## 2010-07-20 ENCOUNTER — Encounter (INDEPENDENT_AMBULATORY_CARE_PROVIDER_SITE_OTHER): Payer: Medicare Other

## 2010-07-20 ENCOUNTER — Ambulatory Visit (HOSPITAL_COMMUNITY): Payer: Medicare Other | Attending: Internal Medicine

## 2010-07-20 DIAGNOSIS — Z7901 Long term (current) use of anticoagulants: Secondary | ICD-10-CM

## 2010-07-20 DIAGNOSIS — I4891 Unspecified atrial fibrillation: Secondary | ICD-10-CM

## 2010-07-20 DIAGNOSIS — I080 Rheumatic disorders of both mitral and aortic valves: Secondary | ICD-10-CM | POA: Insufficient documentation

## 2010-07-20 DIAGNOSIS — I079 Rheumatic tricuspid valve disease, unspecified: Secondary | ICD-10-CM | POA: Insufficient documentation

## 2010-07-20 DIAGNOSIS — I359 Nonrheumatic aortic valve disorder, unspecified: Secondary | ICD-10-CM

## 2010-07-20 DIAGNOSIS — I509 Heart failure, unspecified: Secondary | ICD-10-CM

## 2010-07-20 DIAGNOSIS — I10 Essential (primary) hypertension: Secondary | ICD-10-CM

## 2010-07-20 DIAGNOSIS — I379 Nonrheumatic pulmonary valve disorder, unspecified: Secondary | ICD-10-CM | POA: Insufficient documentation

## 2010-07-20 DIAGNOSIS — R0602 Shortness of breath: Secondary | ICD-10-CM

## 2010-07-20 DIAGNOSIS — I5022 Chronic systolic (congestive) heart failure: Secondary | ICD-10-CM

## 2010-07-20 LAB — BASIC METABOLIC PANEL
CO2: 34 mEq/L — ABNORMAL HIGH (ref 19–32)
Chloride: 100 mEq/L (ref 96–112)
Creatinine, Ser: 1.3 mg/dL — ABNORMAL HIGH (ref 0.4–1.2)
Potassium: 4.7 mEq/L (ref 3.5–5.1)
Sodium: 140 mEq/L (ref 135–145)

## 2010-07-20 LAB — BRAIN NATRIURETIC PEPTIDE: Pro B Natriuretic peptide (BNP): 332.1 pg/mL — ABNORMAL HIGH (ref 0.0–100.0)

## 2010-07-20 LAB — CONVERTED CEMR LAB: POC INR: 1.3

## 2010-07-24 ENCOUNTER — Telehealth: Payer: Self-pay | Admitting: Cardiology

## 2010-07-24 LAB — CONVERTED CEMR LAB: Digitoxin Lvl: 2 ng/mL (ref 0.8–2.0)

## 2010-07-24 NOTE — Letter (Signed)
Summary: CMN for Oxygen/[facility]  CMN for Oxygen/[facility]   Imported By: Sherian Rein 07/16/2010 12:19:58  _____________________________________________________________________  External Attachment:    Type:   Image     Comment:   External Document

## 2010-07-24 NOTE — Medication Information (Signed)
Summary: rov/tm  Anticoagulant Therapy  Managed by: Bethena Midget, RN, BSN Referring MD: Shirlee Latch MD, [place] PCP: Dr Loura Back MD: Tenny Craw MD, Gunnar Fusi Indication 1: Atrial Fibrillation Lab Used: [facility] [place] Site: [address] INR POC 1.3 INR RANGE 2.0-3.0  Dietary changes: no    Health status changes: no    Bleeding/hemorrhagic complications: no    Recent/future hospitalizations: no    Any changes in medication regimen? no    Recent/future dental: no  Any missed doses?: yes     Details: Pt has been taking 2.5mg s daily except 5mg s on Sun & Thurs. for past few weeks daughter states.   Is patient compliant with meds? yes       Allergies: 1)  ! Pcn  Anticoagulation Management History:      The patient is taking warfarin and comes in today for a routine follow up visit.  Positive risk factors for bleeding include an age of 75 years or older.  The bleeding index is 'intermediate risk'.  Positive CHADS2 values include History of CHF, History of HTN, and Age > 68 years old.  Anticoagulation responsible provider: Tenny Craw MD, Gunnar Fusi.  INR POC: 1.3.  Cuvette Lot#: [id number].  Exp: 05/2011.    Anticoagulation Management Assessment/Plan:      The patient's current anticoagulation dose is Warfarin sodium 2.5 mg tabs: Use as directed by Anticoagualtion Clinic.  The target INR is 2.0-3.0.  The next INR is due 07/27/2010.  Anticoagulation instructions were given to patient/daughter.  Results were reviewed/authorized by Bethena Midget, RN, BSN.  She was notified by Bethena Midget, RN, BSN.         Prior Anticoagulation Instructions: INR 1.7  Take an extra 1 tablet today, then start taking 2 tablets daily except 3 tablets on Thursdays and Sundays.  Recheck in 2 weeks.    Current Anticoagulation Instructions: INR 1.3 Today take extra 1 pill then change dose to 2 pills everyday except 3 pills on Sundays and Thursdays. Recheck in one week.

## 2010-07-27 ENCOUNTER — Encounter (INDEPENDENT_AMBULATORY_CARE_PROVIDER_SITE_OTHER): Payer: Medicare Other

## 2010-07-27 ENCOUNTER — Encounter: Payer: Self-pay | Admitting: Cardiovascular Disease

## 2010-07-27 DIAGNOSIS — Z7901 Long term (current) use of anticoagulants: Secondary | ICD-10-CM

## 2010-07-27 DIAGNOSIS — I4891 Unspecified atrial fibrillation: Secondary | ICD-10-CM

## 2010-08-02 NOTE — Medication Information (Signed)
Summary: rov/ewj  Anticoagulant Therapy  Managed by: Cloyde Reams, RN, BSN Referring MD: Shirlee Latch MD, Freida Busman PCP: Dr Loura Back MD: Clifton James MD, Cristal Deer Indication 1: Atrial Fibrillation Lab Used: [facility] [place] Site: [address] INR POC 2.0 INR RANGE 2.0-3.0  Dietary changes: no    Health status changes: no    Bleeding/hemorrhagic complications: no    Recent/future hospitalizations: no    Any changes in medication regimen? no    Recent/future dental: no  Any missed doses?: no       Is patient compliant with meds? yes       Allergies: 1)  ! Pcn 2)  ! Sulfa  Anticoagulation Management History:      The patient is taking warfarin and comes in today for a routine follow up visit.  Positive risk factors for bleeding include an age of 32 years or older.  The bleeding index is 'intermediate risk'.  Positive CHADS2 values include History of CHF, History of HTN, and Age > 32 years old.  Anticoagulation responsible provider: Clifton James MD, Cristal Deer.  INR POC: 2.0.  Exp: 05/2011.    Anticoagulation Management Assessment/Plan:      The patient's current anticoagulation dose is Warfarin sodium 2.5 mg tabs: Use as directed by Anticoagualtion Clinic.  The target INR is 2.0-3.0.  The next INR is due 08/03/2010.  Anticoagulation instructions were given to patient/daughter.  Results were reviewed/authorized by Cloyde Reams, RN, BSN.  She was notified by Cloyde Reams RN.         Prior Anticoagulation Instructions: INR 1.3 Today take extra 1 pill then change dose to 2 pills everyday except 3 pills on Sundays and Thursdays. Recheck in one week.   Current Anticoagulation Instructions: INR 2.0  Start taking 2 tablets daily except 3 tablets on Sundays, Tuesdays, and Thursdays.  Recheck in 1 week.

## 2010-08-02 NOTE — Progress Notes (Addendum)
Summary: Pt daughter returning call   Phone Note Call from Patient Call back at [phone]   Caller: Daughter/ Brianna Robertson Summary of Call: Pt daughter returning call Initial call taken by: Judie Grieve,  July 24, 2010 2:45 PM  Follow-up for Phone Call        I talked with daughter     Appended Document: Pt daughter returning call [facility]

## 2010-08-02 NOTE — Assessment & Plan Note (Signed)
Summary: F1M/PER CHECKOUT/SF/AMD  Medications Added DIOVAN 40 MG TABS (VALSARTAN) take 1 tablet twice daily        Primary Provider:  Dr Duaine Dredge   CC:  rov/ pt had echo this morning.  History of Present Illness: 77 yo with history of dementia, atrial fibrillation, and possible tachycardia-mediated cardiomyopathy returns for office followup.  She has had a complicated course recently.  She went back into atrial fibrillation with rapid ventricular response in 12/11 and was hospitalized in [place].  Myoview showed no ischemia.  Echo showed EF 20% with global hypokinesis.  She was diuresed and rate was controlled.  She was started on coumadin.  No bleeding complcations since starting coumadin.    Patient has significant trouble with memory and is accompanied by her daughter who gives much of the history.  HR is under control, in the 70s today.  No chest pain.  Less short of breath with medical treatment: she is now able to walk up to about a 2 block distance without significant dyspnea.  She does get short of breath going up steps.  No chest pain.    Echo was done today and I reviewed it.  EF 35% with diffuse hypokinesis, mild to moderate AS, moderate to severe MR.    ECG: atrial fibrillation with LBBB, rate 76  Labs (1/12): K 4.5, creatinine 1.1, BNP 294 Labs (2/12): K 4.7, creatinine 1.3,   Current Medications (verified): 1)  Aricept 10 Mg  Tabs (Donepezil Hcl) .... Take 1 Tablet By Mouth Once A Day 2)  Simvastatin 20 Mg Tabs (Simvastatin) .... Take One Tablet By Mouth Daily At Bedtime 3)  Synthroid 25 Mcg  Tabs (Levothyroxine Sodium) .... Take 1 Tablet By Mouth Once A Day 4)  Namenda Titration Pak  Tabs (Memantine Hcl) .... 5mg  Once Daily 5)  Carvedilol 12.5 Mg Tabs (Carvedilol) .... Take One Tablet By Mouth Twice A Day 6)  Spiriva Handihaler 18 Mcg Caps (Tiotropium Bromide Monohydrate) .Marland Kitchen.. 1 Daily 7)  Aspirin 81 Mg Tbec (Aspirin) .... Take One Tablet By Mouth Daily 8)  Anastrozole  1 Mg Tabs (Anastrozole) .... Once Daily 9)  Digoxin 0.125 Mg Tabs (Digoxin) .... Once Daily 10)  Furosemide 40 Mg Tabs (Furosemide) .... Take One Tablet By Mouth Daily. 11)  Klor-Con M20 20 Meq Cr-Tabs (Potassium Chloride Crys Cr) .Marland Kitchen.. 1 By Mouth Daily 12)  Warfarin Sodium 2.5 Mg Tabs (Warfarin Sodium) .... Use As Directed By Anticoagualtion Clinic 13)  Diovan 40 Mg Tabs (Valsartan) .... Take 1/2 Tablet By Mouth Two Times A Day 14)  Celexa 20 Mg Tabs (Citalopram Hydrobromide) .... Take One Tab By Mouth Once Daily 15)  Spironolactone 25 Mg Tabs (Spironolactone) .... Take 1/2  Tablet By Mouth Daily 16)  O2 2 L/m During Sleep  Allergies: 1)  ! Pcn 2)  ! Sulfa  Past History:  Past Medical History: 1. Breast cancer, hx of  1995.  S/P lumpectomy and radiation (node, ER, PR negative). 2. Hypertension 3. Hypothyroidism  4. 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  Patient was cardioverted and maintained on amiodarone, and EF improved to 40%.  Patient stopped amiodarone because of anorexia and stopped coumadin due to rectus sheath hematoma.  Atrial fib with RVR recurred in 12/11.  EF down to 20% on echo.  Patient was rate-controlled and coumadin was restarted.   5. Moderate dementia 6. CKD: mild 7. Cardiomyopathy: Possibly tachycardia-mediated.  In 4/07 in setting of atrial fibrillation with RVR, EF was  30%.  After conversion to NSR, EF 40% in 8/09.  In 12/11, noted to be back in atrial fibrillation with RVR.  Echo (12/11) with EF 20%, severe global hypokinesis, mild to moderate MR, PA systolic pressure 39 mmHg, mild to moderate AS with mean gradient of 13 (may be low gradient AS in the setting of decreased cardiac output).   Myoview in 4/07 was negative for ischemia or infarction.  Myoview (12/11) showed small fixed apical defect likely due to apical thinning.  No ischemia.   Echo (2/12): EF 35% with diffuse hypokinesis (worse anteroseptally),  mild to moderate AS, moderate to severe MR, moderate biatrial enlargement, normal RV, PA systolic pressure 40 mmHg.  7. Hyperlipidemia 8. Pulmonary Nodule - stable 02/08 9. LBBB 10. Aortic stenosis: mild to moderate.  11. MItral regurgitation: moderate to severe.   Family History: Reviewed history from 04/17/2010 and no changes required. Family hx of heart disease,cancer, and arthritis. Brother- pancreatic cancer  Social History: Reviewed history from 06/12/2010 and no changes required. Never Smoked Lives by herself but someone checks in twice a day.  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   Vital Signs:  Patient profile:   77 year old female Height:      66 inches Weight:      157 pounds BMI:     25.43 Pulse rate:   73 / minute Pulse rhythm:   regular BP sitting:   108 / 64  (right arm) Cuff size:   regular  Vitals Entered By: Judithe Modest CMA (July 20, 2010 11:06 AM)  Physical Exam  General:  Elderly woman in no distress.  Neck:  Neck supple, no JVD. No masses, thyromegaly or abnormal cervical nodes. Lungs:  Clear bilaterally to auscultation and percussion. Heart:  Lateral PMI, chest non-tender; irregular rate and rhythm, S1, S2 without rubs or gallops. 3/6 systolic murmur at apex and upper sternal border, S2 is heard clearly.  Carotid upstroke normal, no bruit. Pedals normal pulses. Abdomen:  Bowel sounds positive; abdomen soft and non-tender without masses, organomegaly, or hernias noted. No hepatosplenomegaly. Extremities:  No clubbing or cyanosis. Neurologic:  Alert and oriented x 3. Psych:  Normal affect.   Impression & Recommendations:  Problem # 1:  ATRIAL FIBRILLATION (ICD-427.31) Patient remains in atrial fibrillation but rate is under good control (70s here, 70s per  daughter at home).  She is tolerating coumadin without any signs of bleeding.  She is steady on her feet with no falls.  Her cardiomyopathy may be tachycardia-mediated.  HR is now under control.   Echo showed moderate to severe MR and large atria.  I suspect that she will not be able to hold NSR after cardioversion without amiodarone and she was intolerant of amiodarone in the past (appears to have caused poor appetite, nausea).   EF has improved a bit with recheck, now up to 35%.  - Continue coumadin and Coreg. Will follow rate control and anticoagulation strategy.   Problem # 2:  CONGESTIVE HEART FAILURE (ICD-428.0) Chronic systolic heart failure. EF 35% with moderate to severe MR on most recent echo.  NYHA class III symptoms.  Weight is down another 3 lbs.  Cardiomyopathy may be tachycardia-mediated.  HR is now in the 70s.  Myoview in 12/11 showed no ischemia or infarction.   -  Continue current Lasix dose.  Patient is losing weight and volume seems better.  - Digoxin level elevated.  Decrease digoxin to 0.125 mg 1/2 tab daily and check digoxin level again in a week.  - Increase valsartan to 80 mg two times a day.   - Continue current Coreg and spironolactone.  - BMET/BNP in 2 wks.    Other Orders: T-Digoxin (16109-60454) TLB-BMP (Basic Metabolic Panel-BMET) (80048-METABOL) TLB-BNP (B-Natriuretic Peptide) (83880-BNPR)  Patient Instructions: 1)  Your physician recommends that you schedule a follow-up appointment in: 6 weeks 2)  Your physician recommends that you return for lab work in: today--BMET,BNP,DIGOXIN LEVEL--IN 2 WEEKS PLEASE REDRAW bmet(to be done at PCP office 3)  Your physician has recommended you make the following change in your medication: please increase your diovan to 40mg  twice daily Prescriptions: DIOVAN 40 MG TABS (VALSARTAN) take 1 tablet twice daily  #180 x 3   Entered by:   Ledon Snare, RN   Authorized by:   Marca Ancona, MD   Signed by:   Ledon Snare, RN on  07/20/2010   Method used:   Electronically to        [facility]. [phone]* (retail)       [address]       [place], [place]  [address]       Ph: [phone]       Fax: [phone]   RxID:   [id number]

## 2010-08-03 ENCOUNTER — Other Ambulatory Visit (INDEPENDENT_AMBULATORY_CARE_PROVIDER_SITE_OTHER): Payer: Medicare Other

## 2010-08-03 ENCOUNTER — Encounter: Payer: Self-pay | Admitting: Cardiology

## 2010-08-03 ENCOUNTER — Encounter (INDEPENDENT_AMBULATORY_CARE_PROVIDER_SITE_OTHER): Payer: Medicare Other

## 2010-08-03 DIAGNOSIS — I4891 Unspecified atrial fibrillation: Secondary | ICD-10-CM

## 2010-08-03 DIAGNOSIS — Z7901 Long term (current) use of anticoagulants: Secondary | ICD-10-CM

## 2010-08-03 LAB — CONVERTED CEMR LAB: POC INR: 2.2

## 2010-08-07 NOTE — Medication Information (Signed)
Summary: rov/ewj   Anticoagulant Therapy  Managed by: Windell Hummingbird, RN Referring MD: Shirlee Latch MD, Freida Busman PCP: Dr Duaine Dredge  Supervising MD: Riley Kill MD, Maisie Fus Indication 1: Atrial Fibrillation Lab Used: [facility] [place] Site: [address] INR POC 2.2 INR RANGE 2.0-3.0  Dietary changes: no    Health status changes: no    Bleeding/hemorrhagic complications: no    Recent/future hospitalizations: no    Any changes in medication regimen? no    Recent/future dental: no  Any missed doses?: no       Is patient compliant with meds? yes       Allergies: 1)  ! Pcn 2)  ! Sulfa  Anticoagulation Management History:      The patient is taking warfarin and comes in today for a routine follow up visit.  Positive risk factors for bleeding include an age of 75 years or older.  The bleeding index is 'intermediate risk'.  Positive CHADS2 values include History of CHF, History of HTN, and Age > 31 years old.  Anticoagulation responsible provider: Riley Kill MD, Maisie Fus.  INR POC: 2.2.  Cuvette Lot#: [id number].  Exp: 07/2011.    Anticoagulation Management Assessment/Plan:      The patient's current anticoagulation dose is Warfarin sodium 2.5 mg tabs: Use as directed by Anticoagualtion Clinic.  The target INR is 2.0-3.0.  The next INR is due 08/22/2010.  Anticoagulation instructions were given to patient/daughter.  Results were reviewed/authorized by Windell Hummingbird, RN.  She was notified by Windell Hummingbird, RN.         Prior Anticoagulation Instructions: INR 2.0  Start taking 2 tablets daily except 3 tablets on Sundays, Tuesdays, and Thursdays.  Recheck in 1 week.    Current Anticoagulation Instructions: INR 2.2 Continue taking 2 tablets every day, except take 3 tablets on Sundays, Tuesdays, and Thursdays. Recheck in 3 weeks.

## 2010-08-14 ENCOUNTER — Encounter: Payer: Self-pay | Admitting: Cardiology

## 2010-08-14 LAB — CONVERTED CEMR LAB
CO2: 33 meq/L — ABNORMAL HIGH (ref 19–32)
Calcium: 8.8 mg/dL (ref 8.4–10.5)
Creatinine, Ser: 1.19 mg/dL (ref 0.40–1.20)
Digitoxin Lvl: 0.5 ng/mL — ABNORMAL LOW (ref 0.8–2.0)
Glucose, Bld: 98 mg/dL (ref 70–99)
Sodium: 136 meq/L (ref 135–145)

## 2010-08-21 ENCOUNTER — Encounter: Payer: Self-pay | Admitting: Cardiology

## 2010-08-22 ENCOUNTER — Encounter: Payer: Medicare Other | Admitting: *Deleted

## 2010-08-23 NOTE — Miscellaneous (Signed)
Summary: Ship broker Designated Party Release   Imported By: Roderic Ovens 08/13/2010 14:01:34  _____________________________________________________________________  External Attachment:    Type:   Image     Comment:   External Document

## 2010-08-23 NOTE — Letter (Signed)
Summary: Generic Letter  Architectural technologist, Main Office  [address]   [place], [place] [address]   Phone: [phone]  Fax: [phone]        August 14, 2010 MRN: [id number]    [facility] [address] [place], [place]  [address]    Dear Ms. [facility],  Your lab done 08/03/10 was OK. Please call our office if you have questions.         Sincerely,     Katina Dung, RN, BSN  This letter has been electronically signed by your physician.

## 2010-08-30 ENCOUNTER — Ambulatory Visit (INDEPENDENT_AMBULATORY_CARE_PROVIDER_SITE_OTHER): Payer: Medicare Other | Admitting: *Deleted

## 2010-08-30 ENCOUNTER — Encounter: Payer: Self-pay | Admitting: Cardiology

## 2010-08-30 ENCOUNTER — Ambulatory Visit (INDEPENDENT_AMBULATORY_CARE_PROVIDER_SITE_OTHER): Payer: Medicare Other | Admitting: Cardiology

## 2010-08-30 VITALS — BP 94/58 | HR 69 | Resp 18 | Ht 66.0 in | Wt 159.4 lb

## 2010-08-30 DIAGNOSIS — I509 Heart failure, unspecified: Secondary | ICD-10-CM

## 2010-08-30 DIAGNOSIS — I4891 Unspecified atrial fibrillation: Secondary | ICD-10-CM

## 2010-08-30 DIAGNOSIS — Z7901 Long term (current) use of anticoagulants: Secondary | ICD-10-CM

## 2010-08-30 LAB — POCT INR: INR: 1.3

## 2010-08-30 NOTE — Patient Instructions (Signed)
Schedule an appointment to see Dr Shirlee Latch in 3 months. (July 2012)

## 2010-08-31 NOTE — Assessment & Plan Note (Addendum)
Patient remains in atrial fibrillation but rate is under good control (70s here, 70s per daughter at home).  She is tolerating coumadin without any signs of bleeding.  She is steady on her feet with no falls.  Her cardiomyopathy may be tachycardia-mediated.  HR is now under control.   Echo showed moderate to severe MR and large atria.  I suspect that she will not be able to hold NSR after cardioversion without amiodarone and she was intolerant of amiodarone in the past (appears to have caused poor appetite, nausea).   EF has improved a bit with recheck, now up to 35%.  Digoxin level ok when recently checked.  - Continue coumadin, low dose digoxin, and Coreg. Will follow rate control and anticoagulation strategy.

## 2010-08-31 NOTE — Assessment & Plan Note (Signed)
Chronic systolic heart failure. EF 35% with moderate to severe MR on most recent echo.  NYHA class III symptoms, stable. Cardiomyopathy may be tachycardia-mediated.  HR is now in the 60s.  Myoview in 12/11 showed no ischemia or infarction.   - Continue current Lasix dose.    - Continue current Coreg, valsartan, and spironolactone.

## 2010-08-31 NOTE — Progress Notes (Signed)
PCP: Dr. Duaine Dredge  77 yo with history of dementia, atrial fibrillation, and possible tachycardia-mediated cardiomyopathy returns for office followup.  She has had a complicated course recently.  She went back into atrial fibrillation with rapid ventricular response in 12/11 and was hospitalized in [place].  Myoview showed no ischemia.  Echo showed EF 20% with global hypokinesis.  She was diuresed and rate was controlled.  She was started on coumadin.  No bleeding complcations since starting coumadin.  Repeat echo in 2/12 with rate control showed some improvement in EF, 35-40%.  There was moderate to severe mitral regurgitation and mild aortic stenosis.   Patient has significant trouble with memory and is accompanied by her daughter who gives much of the history.  HR is under control, in the 60s today.  No chest pain.  Less short of breath with medical treatment: she can walk to the mailbox and around the house without trouble.  She does get short of breath going up steps.  No chest pain.  No lightheadedness, no falls.  She is steady on her feet per her daughter.  Weight is up 2 lbs since last appointment.   ECG: atrial fibrillation with LBBB  Labs (1/12): K 4.5, creatinine 1.1, BNP 294 Labs (2/12): K 4.7, creatinine 1.3, BNP 332 Labs (3/12): K 4.6, creatinine 1.19, digoxin 0.5  Allergies:  1)  ! Pcn 2)  ! Sulfa  Past Medical History: 1. Breast cancer, hx of  1995.  S/P lumpectomy and radiation (node, ER, PR negative). 2. Hypertension 3. Hypothyroidism  4. 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  Patient was cardioverted and maintained on amiodarone, and EF improved to 40%.  Patient stopped amiodarone because of anorexia and stopped coumadin due to rectus sheath hematoma.  Atrial fib with RVR recurred in 12/11.  EF down to 20% on echo.  Patient was rate-controlled and coumadin was restarted.   5. Moderate dementia 6. CKD: mild 7.  Cardiomyopathy: Possibly tachycardia-mediated.  In 4/07 in setting of atrial fibrillation with RVR, EF was 30%.  After conversion to NSR, EF 40% in 8/09.  In 12/11, noted to be back in atrial fibrillation with RVR.  Echo (12/11) with EF 20%, severe global hypokinesis, mild to moderate MR, PA systolic pressure 39 mmHg, mild to moderate AS with mean gradient of 13 (may be low gradient AS in the setting of decreased cardiac output).   Myoview in 4/07 was negative for ischemia or infarction.  Myoview (12/11) showed small fixed apical defect likely due to apical thinning.  No ischemia.   Echo (2/12): EF 35% with diffuse hypokinesis (worse anteroseptally), mild AS (mean gradient 16 mmHg), mild AI, moderate to severe MR, moderate biatrial enlargement, normal RV, PA systolic pressure 40 mmHg.  7. Hyperlipidemia 8. Pulmonary Nodule - stable 02/08 9. LBBB 10. Aortic stenosis: mild.  11. MItral regurgitation: moderate to severe.   Family History: Family hx of heart disease,cancer, and arthritis. Brother- pancreatic cancer  Social History: Never Smoked Lives by herself but someone checks in twice a day.  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   Current Outpatient Prescriptions  Medication Sig Dispense Refill  . anastrozole (ARIMIDEX) 1 MG tablet Take 1 mg by mouth daily.        Marland Kitchen aspirin 81  MG tablet Take 81 mg by mouth daily.        . carvedilol (COREG) 12.5 MG tablet Take 12.5 mg by mouth 2 (two) times daily with a meal.        . citalopram (CELEXA) 20 MG tablet Take 20 mg by mouth daily.        . digoxin (LANOXIN) 0.125 MG tablet Take one half tablet daily       . furosemide (LASIX) 40 MG tablet Take 40 mg by mouth daily.       Marland Kitchen levothyroxine (SYNTHROID, LEVOTHROID) 25 MCG tablet Take 25 mcg by mouth daily.          . memantine (NAMENDA TITRATION PACK) tablet pack Take 5 mg by mouth daily.        . potassium chloride SA (K-DUR,KLOR-CON) 20 MEQ tablet Take 20 mEq by mouth daily.        . simvastatin (ZOCOR) 20 MG tablet Take 20 mg by mouth at bedtime.        Marland Kitchen spironolactone (ALDACTONE) 25 MG tablet Take one half tablet by mouth daily       . tiotropium (SPIRIVA) 18 MCG inhalation capsule Place 18 mcg into inhaler and inhale daily.        . valsartan (DIOVAN) 80 MG tablet Take 80 mg by mouth daily.        Marland Kitchen warfarin (COUMADIN) 2.5 MG tablet Take by mouth as directed.        . ARIPiprazole (ABILIFY) 10 MG tablet Take 10 mg by mouth daily.          BP 94/58  Pulse 69  Resp 18  Ht 5\' 6"  (1.676 m)  Wt 159 lb 6.4 oz (72.303 kg)  BMI 25.73 kg/m2 General:  Elderly woman in no distress.  Neck:  Neck supple, no JVD. No masses, thyromegaly or abnormal cervical nodes. Lungs:  Clear bilaterally to auscultation and percussion. Heart:  Lateral PMI, chest non-tender; irregular rate and rhythm, S1, S2 without rubs or gallops. 2/6 systolic murmur at apex and upper sternal border, S2 is heard clearly.  Carotid upstroke normal, no bruit. Pedals normal pulses.  Trace ankle edema.  Abdomen:  Bowel sounds positive; abdomen soft and non-tender without masses, organomegaly, or hernias noted. No hepatosplenomegaly. Extremities:  No clubbing or cyanosis. Neurologic:  Alert and oriented x 3. Psych:  Normal affect.

## 2010-09-12 ENCOUNTER — Ambulatory Visit (INDEPENDENT_AMBULATORY_CARE_PROVIDER_SITE_OTHER): Payer: Medicare Other | Admitting: *Deleted

## 2010-09-12 DIAGNOSIS — I4891 Unspecified atrial fibrillation: Secondary | ICD-10-CM

## 2010-09-12 LAB — POCT INR: INR: 1.9

## 2010-09-12 MED ORDER — WARFARIN SODIUM 2.5 MG PO TABS: 2.5000 mg | ORAL_TABLET | ORAL | Status: DC

## 2010-09-26 ENCOUNTER — Ambulatory Visit (INDEPENDENT_AMBULATORY_CARE_PROVIDER_SITE_OTHER): Payer: Medicare Other | Admitting: *Deleted

## 2010-09-26 DIAGNOSIS — I4891 Unspecified atrial fibrillation: Secondary | ICD-10-CM

## 2010-09-26 LAB — POCT INR: INR: 3.5

## 2010-10-09 NOTE — Assessment & Plan Note (Signed)
[facility]                                 ON-CALL NOTE   Brianna Robertson, Brianna Robertson                     MRN:          [id number]  DATE:10/25/2006                            DOB:          01-14-1926    PRIMARY CARDIOLOGIST:  Dr. Juanda Chance.   Ms. Walpole was apparently seen by Dr. Juanda Chance yesterday and has some  questions about a monitor that was placed.  I do not have access to Dr.  Regino Schultze notes so I am not certain what type of monitor or what duration  she should be wearing it.  She says that when it was set up she was not  given many instructions and does not know what she is supposed to do  with it.  She also says that she was told that she could come back this  morning, Saturday, to the office to have it removed by staff; however,  obviously we are closed today.  I told her that if they told her she was  only supposed to wear it for one day, that if she needed to take it off  then she could go ahead and take it off.  Based on her description of  its size it sounds like it is a 24-hour Holter monitor.  I told her that  she does not have to do anything with regards to activation, etc.  She  said she will try and sponge bathe around it and bring it back to the  office on Monday morning and she is not certain that she was only  supposed to wear it for one day.   She had a second question regarding her Diovan dosage.  She was  previously taking 40 mg b.i.d. and said Dr. Juanda Chance increased it to 160 a  day.  In reviewing the notes I see a note from April where it was  increased to 160 daily.  I recommended continuation at that dose.  Again, I do not have access to yesterday's records and so I do not know  if that dose was adjusted any further.     Nicolasa Ducking, ANP  Electronically Signed    CB/MedQ  DD: 10/25/2006  DT: 10/25/2006  Job #: [id number]

## 2010-10-09 NOTE — Assessment & Plan Note (Signed)
[facility]                            CARDIOLOGY OFFICE NOTE   Brianna Robertson, Brianna Robertson                   MRN:          [id number]  DATE:12/15/2007                            DOB:          1925-10-16    PRIMARY CARE PHYSICIAN:  Barbette Hair. Artist Pais, DO   REFERRING PHYSICIAN:  Dr. Caro Hight, [facility].   REASON FOR CONSULTATION:  Preoperative evaluation.   CLINICAL HISTORY:  Brianna Robertson was referred for preop evaluation  prior to breast surgery.  She is 77 years old and was hospitalized in  April 2007 with atrial fibrillation, rapid rate, congested heart  failure, and ejection fraction of 30%.  She was finally converted to  sinus rhythm and maintained on amiodarone and her LV function returned  to 50%.  She never underwent catheterization, but she did have adenosine  Myoview scan in 2007, which showed no evidence of ischemia.   She has been doing fairly well recently and states that she has had no  symptoms of chest pain, shortness of breath, or palpitations.   She has had breast cancer about 14 years ago and has been followed by  Dr. Loralee Pacas in [place] since that time and was recently found to have a  new breast mass.  She is scheduled to have surgery about a month from  now.   PAST MEDICAL HISTORY:  Significant for memory deficit, which has become  worse recently.  She also has history of hypertension, renal  insufficiency, and hyperlipidemia.  She also has a pulmonary nodule,  which was initially suspicious for cancer and has been followed by Dr.  Dalene Carrow in On[place], but this has been stable.   CURRENT MEDICATIONS:  1. Simvastatin 40 mg q.h.s.  2. Amiodarone 100 mg daily.  3. Diovan 160 mg daily.  4. Aspirin.  5. Aricept.  6. Namenda.  7. Lasix 20 mg daily.  8. KCl 20 mEq daily.  9. Synthroid.   PHYSICAL EXAMINATION:  VITAL SIGNS:  The blood pressure is 126/69 and  the pulse is 61 and regular.  NECK:  There was no venous  distention.  The carotid pulses were full.  There were transmitted bruits from below.  CARDIAC:  Rhythm was regular.  There is a grade 2/6 systolic ejection  murmur at the left sternal edge transmitted to the base.  ABDOMEN:  Soft with normal bowel sounds.  There is no  hepatosplenomegaly.  EXTREMITIES:  Peripheral pulses are full.  No peripheral edema.  MUSCULOSKELETAL:  No deformities.  SKIN:  Warm and dry.  NEUROLOGIC:  No focal neurological signs.   IMPRESSION:  1. Preoperative evaluation prior to breast surgery.  2. History of paroxysmal atrial fibrillation, now controlled on      amiodarone.  3. History of rate-related cardiomyopathy, now resolved with ejection      fraction of 50%.  4. History of systolic heart failure, now resolved and compensated.  5. Mild aortic stenosis with mean aortic valve gradient of 10 mm by      last echo.  6. History of remote breast cancer.  7. Pulmonary nodule on CT, followed  by Dr. Dalene Carrow.  8. Hyperlipidemia.  9. Dementia.   RECOMMENDATIONS:  Brianna Robertson appears stable from a cardiac standpoint.  However, it has been 2 years since she had evaluation of LV function and  evaluation for ischemia.  We will arrange for her to have a 2-D echo and  an adenosine rest stress Myoview scan next week as a preop evaluation  prior to surgery.  If these are okay, then I think her surgical risk  from a cardiac standpoint should be low.  I think it is appropriate to  proceed.  We will be in touch with Dr. Loralee Pacas regarding this after the  tests are available.     Bruce Elvera Lennox Juanda Chance, MD, [facility]  Electronically Signed    BRB/MedQ  DD: 12/15/2007  DT: 12/16/2007  Job #: [id number]   cc:   Caro Hight

## 2010-10-09 NOTE — Assessment & Plan Note (Signed)
[facility]                            CARDIOLOGY OFFICE NOTE   MELAYAH, SKORUPSKI                   MRN:          [id number]  DATE:06/10/2007                            DOB:          25-Feb-1926    PRIMARY CARE PHYSICIAN:  Barbette Hair. Artist Pais, DO   ONCOLOGIST:  Jamie Brookes. Odogwu, M.D.   CLINICAL HISTORY:  Ivianna Notch returns for follow-up management of her  congestive heart failure, atrial fibrillation.  She was admitted to the  hospital in April 2007 with atrial fibrillation and rapid rate and  congestive heart failure with an ejection fraction of 30%.  Her atrial  fibrillation converted to sinus rhythm with treatment of amiodarone and  left ventricular function improved to normal ejection fraction of 50%.  She was not kept on Coumadin because of a peri-rectal hematoma that  developed when she was in the hospital.   She had done fairly well from the standpoint of her heart and has had no  symptoms of chest pain, shortness breath or palpitations.   PAST MEDICAL HISTORY:  1. Significant for dementia which has been increasing problem.  2. She also has hypertension.  3. Renal insufficiency.  4. Hyperlipidemia.  5. Pulmonary nodule with pain followed by Dr. Dalene Carrow.   CURRENT MEDICATIONS:  Include Diovan, aspirin, Aricept, Cerefolin,  Namenda, Lasix, potassium, Synthroid, amiodarone and simvastatin.   PHYSICAL EXAMINATION:  Today the blood pressure is 130/60 and pulse 60  and regular.  There was no venous tension.  Carotid pulses were full and there were no  bruits.  CHEST:  Was clear without rales or rhonchi.  HEART:  Rhythm was regular.  She did have a grade 2/6 systolic ejection  murmur at the left sternal edge radiating to the base.  ABDOMEN:  Soft with normal bowel sounds.  There is no  hepatosplenomegaly.  No peripheral edema.   Electrocardiogram showed left bundle branch block and sinus rhythm.   IMPRESSION:  1. History of  paroxysmal atrial fibrillation, now controlled with      amiodarone.  2. Rate related cardiomyopathy, now resolved, ejection fraction 50%.  3. History of systolic congestive heart failure, now of compensated.  4. History of  previous hypoxia and cough, now resolved.  5. Mild aortic stenosis with mean aortic valve gradient 10 mmHg.  6. History of breast cancer.  7. Pulmonary nodule on CT followed by Dr. Dalene Carrow.  8. Hyperlipidemia.  9. Dementia.   RECOMMENDATIONS:  I think Mrs. Gafford is doing well.  She wanted to get  off of some of her medicines and I think we can cut her  simvastatin  back from 80 to 40 a day to minimize side effects.  I think we can also  cut her amiodarone back from 200 to 100 a day to minimize side effects.  I will plan to see her back in follow-up in 6 months.   ADDENDUM:  She is due to have an oncology visit in February and we asked  her to call and make an appointment and we also arranged for her to have  follow-up with her primary  physician Dr. Artist Pais.     Bruce Elvera Lennox Juanda Chance, MD, [facility]  Electronically Signed    BRB/MedQ  DD: 06/10/2007  DT: 06/10/2007  Job #: [id number]

## 2010-10-10 ENCOUNTER — Ambulatory Visit (INDEPENDENT_AMBULATORY_CARE_PROVIDER_SITE_OTHER): Payer: Medicare Other | Admitting: *Deleted

## 2010-10-10 DIAGNOSIS — I4891 Unspecified atrial fibrillation: Secondary | ICD-10-CM

## 2010-10-10 LAB — POCT INR: INR: 3.8

## 2010-10-12 NOTE — Assessment & Plan Note (Signed)
[facility]                            CARDIOLOGY OFFICE NOTE   Brianna [facility], Brianna Robertson                   MRN:          [id number]  DATE:09/18/2006                            DOB:          [birth year]    PRIMARY CARE PHYSICIAN:  Dr. Thomos Lemons.   ONCOLOGIST:  Arlan Organ.   CLINICAL HISTORY:  Brianna [facility] is returned for followup/management of  her congestive heart failure and atrial fibrillation.  She was admitted  about 9 months ago with congestive heart failure and atrial fibrillation  and at that time had an ejection fraction of 20%.  She was converted to  sinus rhythm but had to come off Coumadin due to a spontaneous  retroperitoneal bleed.  She was left on rate control medications and  medications for her LV dysfunction and heart failure and she improved.  Her ejection fraction by last measure on Myoview scan was 55%.   She said she has been doing quite well with her heart and has had no  chest pain, shortness of breath, palpitations or swelling.   PAST MEDICAL HISTORY:  Significant for:  1. Dementia.  2. Hypertension.  3. Renal insufficiency.  4. Hyperlipidemia.  5. She also has a pulmonary nodule that is being followed by Dr.      Dalene Carrow with serial CT scans.   CURRENT MEDICATIONS:  Synthroid, amiodarone, Diovan, aspirin, Aricept,  calcium, Coenzyme, Pepcid, simvastatin, Lasix and potassium.   EXAMINATION:  The blood pressure is 150/70 and the pulse 61 and regular.  There was no venous distention.  Carotid pulses were full without  bruits.  CHEST:  Clear.  CARDIAC RHYTHM:  Regular.  There was a grade 2/6 systolic murmur at left  sternal edge.  This radiated to the base.  ABDOMEN:  Soft with normal bowel sounds.  There is no  hepatosplenomegaly.  Peripheral pulses are full and there is no  peripheral edema.   ECG showed left bundle branch block.   IMPRESSION:  1. Rately, a cardiomyopathy now with improvement in left  ventricular      function with an ejection fraction of 50%.  2. Paroxysmal atrial fibrillation controlled on rate control      medications.  3. Not a Coumadin candidate.  4. Congestive heart failure secondary to diastolic dysfunction, now      compensated.  5. History of hypoxia and cough, now resolved.  6. Mild aortic stenosis with a mean aortic valve gradient of 10 mm.  7. History of breast cancer.  8. Nodule on pulmonary CT followed by Dr. Dalene Carrow.  9. Hyperlipidemia.  10.Dementia.   RECOMMENDATIONS:  [name] I think is doing well.  She is anxious to  come off of some of her medications and I think with normalization of  her LV function we can probably get by without Lasix and potassium so we  will discontinue those.  Her blood pressure is not optimal, so will  increase her Diovan from 40 b.i.d. to 160 once a day.  I will plan to  see her back in followup in 6 months.     Smitty Cords  R. Juanda Chance, MD, [facility]  Electronically Signed    BRB/MedQ  DD: 09/18/2006  DT: 09/18/2006  Job #: [id number]

## 2010-10-12 NOTE — Assessment & Plan Note (Signed)
[facility]                              CARDIOLOGY OFFICE NOTE   [name], [name]                   MRN:          [id number]  DATE:03/10/2006                            DOB:          Aug 20, 1925    PRIMARY CARE PHYSICIAN:  Brianna Hair. Artist Pais DO.   ONCOLOGIST:  [facility], M.D.   CLINICAL HISTORY:  Brianna [facility] returned for followup management of her  atrial fibrillation and congestive heart failure.  She was admitted to the  [facility] in April with atrial fib and congestive heart failure.  At that  time, she had an ejection fraction of 20%.  She underwent cardioversion and  has been maintained on sinus rhythm.  She was not given Coumadin because she  had a spontaneous retroperitoneal bleed in the [facility].  She was evaluated  with a Myoview scan in June, which showed no definite evidence of ischemia.   She has been doing quite well from a cardiac standpoint.  She has had no  recurrent palpitations.  No shortness of breath.  And, no chest pain.   PAST MEDICAL HISTORY:  1. Previous mastectomy.  2. Pulmonary nodule that was found at the time of her initial      hospitalization.  Dr. Dalene [facility] is following this with serial CT scans but      has not recommended any invasive tests.  3. She also has two cysts on her liver which are being followed.  4. Her past medical history is also significant for hypothyroidism.  5. Hyperlipidemia.  6. Dementia.  7. Degenerative joint disease.  8. Chronic renal insufficiency.  9. Hypertension.   CURRENT MEDICATIONS:  Potassium, Lasix, Synthroid, amiodarone, Diovan,  Simvastatin, Aricept, and Pepcid.   PHYSICAL EXAMINATION:  VITAL SIGNS:  Blood pressure 128/56, pulse 61 and  regular.  NECK:  There is no venous distention.  The carotid pulses were full with  transmitted bruits.  We are going to stop her Lasix and potassium too.  CHEST:  Clear without rales or rhonchi.  HEART:  Rhythm is regular.  There  is a grade 2/6 systolic ejection murmur at  the left sternal edge.  ABDOMEN:  Soft without organomegaly.  EXTREMITIES:  Peripheral pulses were full.  There is no peripheral edema.   An EKG showed left bundle branch block and sinus rhythm.   IMPRESSION:  1. Paroxysmal atrial fibrillation, now controlled on amiodarone.  Not a      Coumadin candidate due to retroperitoneal bleed.  2. Nonischemic cardiomyopathy.  Ejection fraction of 30%, now improved to      50% with control of atrial fibrillation.  3. Congestive heart failure, now resolved.  4. History of hypoxia and cough, now resolved.  5. 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  6. History of breast cancer and lesion on pulmonary CT, followed by Dr.      Dalene [facility].  7. Hyperlipidemia.  8. Dementia.   RECOMMENDATIONS:  I think Brianna [facility] is doing extremely well from a cardiac  standpoint.  She would like to get off of some of her medicines and as her  LV  function is normal, I think we can stop the Lasix and potassium.  We will  also get a TSH on her today both for amiodarone surveillance and because of  her known hypothyroidism.  Her daughter would like to start back Namenda and  we will check for any __________  and we will resume that if there are no  problems.  She also requests a flu shot if she did not have one in April,  and apparently we were not giving them then.  I will plan to see her back in  six months.            ______________________________  Brianna Beals. Juanda Chance, MD, [facility]     BRB/MedQ  DD:  03/10/2006  DT:  03/11/2006  Job #:  [id number]   cc:   Brianna Hair. Artist Pais, DO  Lauretta I. [facility], M.D.

## 2010-10-12 NOTE — Discharge Summary (Signed)
NAMEHAILA, DENA NO.:  [id number]   MEDICAL RECORD NO.:  [id number]          PATIENT TYPE:  INP   LOCATION:  2924                         FACILITY:  [facility]   PHYSICIAN:  Arvilla Meres, M.D. LHCDATE OF BIRTH:  12-10-1925   DATE OF ADMISSION:  08/26/2005  DATE OF DISCHARGE:  08/28/2005                           DISCHARGE SUMMARY - REFERRING   DISCHARGE DIAGNOSES:  1.  Atrial fibrillation with conversion to normal sinus rhythm with      amiodarone therapy.  2.  Hypothyroidism secondary to Synthroid.  3.  Probable metastatic breast cancer.  4.  Congestive heart failure with ejection fraction of 30% possibly      tachycardia induced.  5.  Cirrhosis.  6.  Hypoxia.  7.  Abdominal discomfort/nausea.  8.  Right perirectal hematoma. No Coumadin recommended.  9.  Nonsustained ventricular tachycardia.  10. Nonischemic cardiomyopathy.  11. Anemia secondary to internal hematoma.  12. Confusion.  13. Hyperlipidemia treated.  14. History of breast cancer.  15. Bradycardia.   PROCEDURE:  Failed TEE cardioversion.   HISTORY:  Ms. Mcmackin is a 77 year old white female who was referred to the  emergency room from [facility] with findings of atrial fibrillation  with a rapid ventricular rate. At the time of admission, it was noted that  she was a difficult historian and she described chronic shortness of breath  and cough over the past year; however, recently it seemed to have gotten  worse prior. She denies any sense of palpitations but states that she feels  that her heartbeat has been fast since 2-3 days prior to her presentation.   PAST MEDICAL HISTORY:  Notable for breast cancer with radiation and  chemotherapy. History of hypothyroidism, hypertension, hyperlipidemia,  chronic interventricular conduction delay of left bundle branch block type,  known nonischemic cardiomyopathy with an EF of 40-45% without coronary  artery disease in 1994.   LABORATORY  DATA:  Chest x-ray on admission showed cardiomegaly and COPD, no  active processes. Chest CT on the 3rd revealed a 10 x 14 mm nodule  associated with the left upper lobe near the cardiac apex with bilateral  pleural effusion, right larger than the left, no evidence of pulmonary  emboli. Multiple atelectasis and/or scarring with a probable hematocele  associated with the medial aspect of the right lower lobe just posterior to  the trachea. No bony metastases or acute fracture seen. PET scan  recommended. PET scan performed on August 28, 2005. Nodule within the left  lung demonstrate abnormal increased FDG uptake concerning for metastasis.  There is abnormal increased activity within the right clavicle concerning  for bone metastasis. Abdominal and pelvic CT was performed on the 5th and  showed evidence of cirrhosis. A vague 1.1 cm of enhancement in the left  lower hepatic segment in the setting of cirrhosis, hepatocellular carcinoma  could not be excluded, mild splenomegaly. There was no acute abnormalities  in the pelvic region. Bone scan on the 6th did not show any abnormal foci of  increased or decreased radiotracer uptake concerning for osteo metastasis.  Repeat chest x-ray on the 8th showed  a slight interval increase in bilateral  pleural effusions and bibasilar atelectasis. Repeat CT of the pelvis and the  abdomen on the 9th showed a new right rectus sheath hematoma and persistent  right effusion with decrease in the left pleural effusion. It also showed an  8 x 14 cm anterior extraperitoneal pelvic hematoma contiguous with large  right rectus sheath hematoma, colonic diverticulosis without CT evidence of  diverticulitis. EKG on admission showed atrial fibrillation with a  ventricular rate of 143, left bundle branch block. Subsequent EKGs continue  to show atrial fibrillation. An EKG on April 8 showed sinus bradycardia with  a ventricular rate of 53, left bundle branch block. Admission  weight was  185, admission H&H was 11.7 and 34.1, normal indices. Platelet 174, WBC 3.9.  Subsequent hematology essentially remained unchanged until the morning of  September 02, 2005 H&H was noted to be low at 7.5 and 21.7, normal indices,  platelets 209, WBC 5.0. Post transfusion CBC was 9.4 and 26.9, normal  indices, platelets 184, WBC 5.4. Prior to discharge on the 13th, CBC was  10.2 and 30.3, normal indices. Platelets were noted to be clumping on the  smear but appeared adequate. WBCs were 7.8. Sed rate on the 4th was 17,  admission PTT was 34, PT 14.3. On the 7th, PT was 19.9, INR 1.7. On the 8th,  PT was 26.3, INR 2.4. On the 9th, PT was 28.8 and 2.7. On the 10th, PT was  24.5, INR 2.2. By the 11th, INR was 1.5. Prior to discharge, PT was 14.2 and  INR of 1.1. Admission sodium was 142, potassium 3.7, BUN 14, creatinine 0.9,  normal LFTs except for a total bilirubin slightly elevated at 1.6. Protein  and albumin were slightly low at 5.8 and 3.2. On the 8th, repeat chemistry  showed total bilirubin of 0.8 __________ remained unchanged throughout her  admission. At the time of discharge, sodium was 137, potassium 4.2, BUN 12,  creatinine 1.0, glucose 109. A serial of 3 CK-MBs and troponin's were  negative for myocardial infarction. Admission BNP was 839 and on the 3rd had  dropped to 638. By the 9th, her BNP was 70, on the 11th 63. Fasting lipids  on the third showed a total cholesterol of 158, triglycerides 71, HDL 52,  LDL 92. TSH on admission was 0.041 and her free T4 was elevated at 1.92.  Iron studies performed on the 12th showed an iron level that was low at 35,  TIBC at 240, percent fat at 15, UIBC 205 and ferritin was elevated at 376.  Tumor marker alpha fetoprot was elevated at 13.4. However cancer AG 27.29  was 20 and CEA was 3.6. Hepatitis B surface core and hepatitis A and C were all negative. Stools were heme negative. Urine culture obtained on the 8th  was unremarkable.    HOSPITAL COURSE:  Ms. Tuckett was admitted to 2927 by Dr. Diona Browner. She was  placed on IV heparin overnight. She had ruled out for myocardial infarction.  She had not had any further problems with shortness of breath however  remained dyspneic with exertion. She was noted to have some lower extremity  edema. Her ventricular rate was better controlled and it was noted that she  had some episodes of bradycardia with 2.56 second pauses. Digoxin was held  and her IV Cardizem was changed to p.o. With her low TSH, her Synthroid was  decreased. Given her atrial fibrillation, anticipated TEE cardioversion was  anticipated. Echocardiogram  was obtained on the 3rd and revealed an ejection  fraction of approximately 30%. It was noted that the heart rate was fast and  recommended repeat when the heart rate was slower. Mild to moderate aortic  calcification consistent with mild AS with an AVA of 1.42 by VTI. There was  moderate AI, bilateral atrial enlargement. Pulmonary was asked to consult on  August 27, 2005 to assist with her chronic cough and shortness of breath and  abnormal CT scan. Nursing noted on August 28, 2005, the patient experienced  some confusion at night but reoriented easily. Coumadin was continued,  amiodarone was anticipated. TEE cardioversion was performed on August 29, 2005  by Dr. Jens Som. The ejection fraction was 40% with moderate diffuse left  ventricular hypokinesis, mild AI, mild ascending aortic dilatation and mild  atheroma descending aorta, moderate to severe MR, left atrial enlargement  without thrombus. She was successfully cardioverted initially with 150  joules and she did experience transient normal sinus rhythm however reverted  back to atrial fibrillation. Amiodarone was started in hopes to reattempt  cardioversion at a later date. Hematology/oncology was consulted on August 29, 2005 with Dr. Dalene Carrow. The recommendations of CT scans and markers were  carried out. Pulmonary  felt that she was not a candidate for immediate  surgery or biopsy thus recommended followup as an outpatient with PFTs.  After amiodarone was begun Dr. Gala Romney noted on August 31, 2005 that the  patient had converted to normal sinus rhythm/sinus bradycardia. He noted  some mild tenderness in the right lower quadrant. Discharge planning was  begun however she experienced some bradycardia that was asymptomatic.  Medications were adjusted, she remained in the hospital. Pharmacy assisted  with Coumadin management. On September 02, 2005, nursing noted a 3 beat run of  nonsustained ventricular tachycardia. On the morning of April 9, her H&H had  dropped suddenly to 7.5 and 21.7. CT scans showed hematoma as described  above. Her Coumadin and heparin were discontinued. Post transfusion of 2  units packed RBCs resulted in an increased hemoglobin and hematocrit. Hepatitis panel was checked with the presence of a cirrhotic liver. GI  consult was obtained to the patient's complaint of nausea with food. GI  consult on September 05, 2005 felt that if her nausea persisted or continued she  should undergo an EGD however her EGD in December 2006 was noted to be  normal. Dr. Christella Hartigan recommended continued proton pump inhibitor daily,  Imodium if she had more than 2-3 bowel movements per day and would evaluate  her as an outpatient visit. Social work brief psychological assessment was  also performed. By September 06, 2005, it was felt that the patient could be  discharged home. Case management assisted with discharge needs in regards to  [facility] home [facility] setting up the home O2, delivering portable tank  with home PT and OT. Prior to discharge, Dr. Gala Romney ordered a urinalysis  and a urine culture; however, at the time of this dictation these are  pending.   DISPOSITION:  She is discharged home after maintaining a low salt, fat,  cholesterol diet. Her activity is essentially unrestricted. She was asked to   bring all medications to all appointments. Her new prescriptions include:   1.  Amiodarone 200 mg half tablet daily.  2.  Diovan 40 mg daily.  3.  Aspirin 325 mg daily.  4.  Lasix 20 mg daily.  5.  K-Dur 20 mEq daily.  6.  Protonix 40  mg daily.  7.  Ensure vanilla 2 times a day.  8.  Imodium as directed for diarrhea.  9.  She was given a new prescription for a decreased Synthroid dose, 0.75 mg      daily.  10. She was asked to continue her Aricept 10 mg daily, Zocor 80 mg q.h.s.      and her Combivent as previously.   She was asked to call Monday to arrange a followup appointment with Dr.  Charlies Constable and Dr. Sherene Sires for cardiology and pulmonary respectively. She has  an appointment with Dr. Rob Bunting on May 1 at 10 a.m. Dr. Sherene Sires mentions  in the chart that he would like to followup from a pulmonary aspect with  PFTs and Dr. Dalene Carrow has discussed with the patient's family also followup  and they are to call for appointment. She was instructed not to take her  Toprol, HCTZ, enalapril or Namenda. She will need blood work in the next  week or so to recheck a CBC given  her pelvic hematoma. She will also need blood work in 3 months with a TSH  and a free T4 given her adjustment of her Synthroid. Dr. Gala Romney states  that he will followup on her urinalysis and urine culture Monday as it is  still pending at the time of this dictation.      Joellyn Rued, P.A. [facility]      Arvilla Meres, M.D. [facility]  Electronically Signed    EW/MEDQ  D:  09/06/2005  T:  09/06/2005  Job:  [id number]   cc:   Dr. Merla Riches, [facility] Flora Lipps, M.D. [facility]  [address]  [address]  [place]  [place] [address]   Rachael Fee, M.D.   Lauretta I. Odogwu, M.D.  Fax: [phone]   Charlaine Dalton. Sherene Sires, M.D. [facility]  [address]  [place]  [place] [address]

## 2010-10-12 NOTE — Assessment & Plan Note (Signed)
[facility]                           GASTROENTEROLOGY OFFICE NOTE   Brianna Robertson, Brianna Robertson                   MRN:          [id number]  DATE:02/04/2006                            DOB:          07/24/1925    REFERRING PHYSICIAN:  Dr. Dalene Carrow.   REASON FOR REFERRAL:  Abnormal imaging of the liver.   HISTORY OF PRESENT ILLNESS:  Brianna Robertson is a 77 year old white female with  multiple medical problems, as outlined below.  She has had abnormal CT scans  of the liver, and an abnormal MRI of the liver.  The patient can provide  little information due to severe memory deficits.  I was able to obtain  additional information from Dr. Lonell Face office, and I spoke to the  patient's daughter, Brianna Robertson, by phone as well.  Brianna Robertson had two CT  scans of the abdomen and pelvis in April 2007 at [facility].  There  were two lesions, one in the right posterior hepatic segment, and one in the  left lateral hepatic segment that measured 8 mm or less, and were not well  imaged.  The contour of the liver was somewhat nodular, suggestive of  cirrhosis, but there were no other findings consistent with portal  hypertension on the study.  She also has no history of liver disease or  known risk factors for cirrhosis.  She apparently had a CT scan of the  abdomen also performed in [place], [place].  Apparently Dr. Dalene Carrow has  this study, but I do not have it at the time of this dictation.  She  underwent an MRI of the abdomen with and without contrast on August 27 of  this year.  It was a limited examination due to breathing motion artifact.  The right hepatic lobe lesion noted on the prior CT scan had findings  typical of a benign hemangioma.  The left hepatic lobe lesion was not as  well seen, but appeared to be a small hypervascular lesion.  No other  significant findings were noted.  The liver contour appeared to be normal on  MRI, and there were no other  signs of portal hypertension noted.  There has  been no history of alcohol abuse, hepatitis, or family history of cirrhosis.  The patient has no gastrointestinal complaints.  The patient's daughter  relates no gastrointestinal complaints, and also no risk factors for  cirrhosis.   PAST MEDICAL HISTORY:  1. Hypertension.  2. Congestive heart failure.  3. Mild aortic regurgitation.  4. Mild mitral regurgitation,  5. Hypothyroidism.  6. Hyperlipidemia.  7. Dementia.  8. History of atrial fibrillation.  9. History of rectus sheath hematoma and retroperitoneal hematoma while on      Coumadin.  10.History of breast cancer with pulmonary lesions on CT scan.  11.History of chronic venous insufficiency.  12.Spinal degenerative joint disease.   MEDICATIONS:  Listed on the chart, updated and reviewed.   MEDICATION ALLERGIES:  None known.   Social history and review of systems per the handwritten evaluation form.   PHYSICAL EXAMINATION:  In no acute distress, height 5 feet 6  inches, weight  171 pounds.  Blood pressure is 122/70, pulse is 60 and regular.  HEENT:  Anicteric sclerae, oropharynx is clear.  CHEST:  Clear to auscultation bilaterally.  CARDIAC:  Regular rate and rhythm without murmurs appreciated.  ABDOMEN:  Soft, nontender, nondistended.  Normal active bowel sounds, no  palpable organomegaly, masses or hernias.  EXTREMITIES:  Without clubbing, cyanosis or edema.  NEUROLOGIC:  Alert and oriented x3.  Grossly nonfocal.   LABORATORY DATA:  From January 07, 2006, white blood count 2.4, hemoglobin  11.9.  Liver function tests entirely normal.  Alpha fetoprotein 15.6, which  is minimally elevated.   ASSESSMENT AND PLAN:  Two small liver lesions.  The right hepatic lesion has  very typical features of a small benign hemangioma, and the left hepatic  lobe lesion may be the same as well, but this was not confirmed.  The  lesions have not changed substantially between the CT scan in  the spring and  the MRI this summer.  She does have a CT scan from [place] that is in Dr.  Lonell Face office, and the radiologist will need to compare this CT scan with  her other imaging studies.  The minimally elevated alpha fetoprotein is  nonspecific.  I would recommend a followup CT scan with contrast or a  followup MRI with contrast to reevaluate these non-worrisome liver lesions  in approximately four months.  I also recommend repeating her alpha  fetoprotein in four months.  She will return to Dr. Dalene Carrow for ongoing care.  I will see her back on referral.                                   Venita Lick. Russella Dar, MD, [facility]   MTS/MedQ  DD:  02/05/2006  DT:  02/06/2006  Job #:  [id number]   cc:   Vicente Serene I. Odogwu, M.D.

## 2010-10-12 NOTE — H&P (Signed)
Brianna Robertson, Brianna Robertson NO.:  [id number]   MEDICAL RECORD NO.:  [id number]          PATIENT TYPE:  INP   LOCATION:  2003                         FACILITY:  [facility]   PHYSICIAN:  Lorain Childes, M.D. LHCDATE OF BIRTH:  [birth year]   DATE OF ADMISSION:  09/08/2005  DATE OF DISCHARGE:                                HISTORY & PHYSICAL   PRIMARY CARDIOLOGIST:  Charlies Constable, M.D.   PRIMARY PULMONOLOGIST:  Charlaine Dalton. Sherene Sires, M.D.   PRIMARY CARE PHYSICIAN:  Robert P. Merla Riches, M.D.   CHIEF COMPLAINT:  Atrial fibrillation.   The patient is a pleasant 77 year old female with recent diagnosis of atrial  fibrillation.  In April 2007, she was hospitalized for approximately 2  weeks.  She was discharged on September 06, 2005. She is status post TEE with  cardioversion; however, this failed, and she quickly reverted to atrial  fibrillation.  She was begun on amiodarone and pharmacologically converted.  She was discharged from the hospital on April 13.  She went home and was  feeling well.  Home health was visiting her today and noted her pulse was  irregular this afternoon with a rate in the 130s.  Her daughter reports that  she checked her pulse earlier in the day, and she was regular with a rate in  the 70s.  The patient reports she felt fine then and then later in the  afternoon she felt a bit fatigued.  She took amiodarone and also blood  pressure pill, Diovan.  Her pulse improved, but it still was noted to be  irregular.  She called and was referred to come in for further evaluation.  She denies any chest pain, shortness of breath, lightheadedness, or syncope.  She reports generalized weakness as her symptom.  Her daughter is present at  the bedside and reports that she was doing well following hospitalization  until now with frequent monitor of her pulse.  In the ER, she had an EKG  done which showed atrial fibrillation with rapid ventricular response.  Cardiology was  called for admission.   PAST MEDICAL HISTORY:  1.  Diagnosis of atrial fibrillation diagnosed May 30, 2005.  2.  Status post prolonged hospitalization.  She had attempted DC      cardioversion with 100 joules which converted her to sinus rhythm      transiently, but then she reverted back to atrial fibrillation.  She      then was begun on amiodarone and converted to normal sinus rhythm.  She      initially was anticoagulated with heparin and Coumadin; however, this      was stopped because she had a perirectal hematoma which required 2 units      packed red blood cells.  The decision was made at that point to keep her      off of Coumadin and maintain her anticoagulated with aspirin for this      325 mg daily.  During that hospitalization, she had a TEE which was      negative for left atrial thrombus.  Her EF was noted to be  40% at that      time.  She had a transthoracic echocardiogram when she was in atrial      fibrillation which revealed an EF of 30%, mildly dilated ventricle, mild      AS, mild AI, moderate MR.  Her left atrium was enlarged.  Her right      ventricle was normal.  3.  Hypothyroidism on Synthroid therapy.  Her dose was decreased during her      last hospitalization.  4.  Perirectal hematoma.  As described above, she is not on Coumadin due to      this recent hematoma which required 2 units packed red blood cells.  5.  Cirrhosis noted on CT.  6.  History of breast cancer status post radiation and chemotherapy.  She      has possible lung nodules noted during her last hospitalization. She had      FDP uptake noted in these nodules.  Oncology was consulted, and she is      going to be further worked up as an outpatient.  7.  Abdominal discomfort with nausea and vomiting.  8.  Heart failure with EF of 30 to 40% as described above.  She is status      post cardiac catheterization in 1994 which revealed nonobstructive      disease.  9.  Chronic renal insufficiency.   10. Left bundle branch block.  11. Home oxygen on 2 liters nasal cannula.  12. Dyslipidemia.   SOCIAL HISTORY:  She lives in [place].  She is retired.  Her daughter is  very involved in her care.  She is retired from the post office.  She denies  any tobacco, alcohol, or other drugs.   MEDICATIONS AT HOME:  1.  Aspirin 325 mg p.o. daily.  2.  Amiodarone 100 mg p.o. daily.  3.  Diovan 40 mg p.o. daily.  4.  Lasix 20 mg p.o. daily.  5.  K-Dur 20 mEq daily.  6.  Protonix 40 mg p.o. daily.  7.  Synthroid 0.75 mg p.o. daily.  8.  Aricept 10 mg p.o. daily.  9.  Simvastatin 80 mg p.o. nightly.  10. Combivent p.r.n.  11. Oxygen 2 liters nasal cannula.   FAMILY HISTORY:  Her mother died at the age of 38 from coronary disease.  Father died at the age of 41 from coronary disease and heart failure.   REVIEW OF SYSTEMS:  She had a low-grade fever to 99 degrees last evening but  was normothermic today.  She denies any chills.  No sputum production.  She  has a chronic cough which is unchanged.  No urinary symptoms, no diarrhea.  She denies any headache or visual changes.  No increasing shortness of  breath, no dyspnea on exertion.  No vomiting, no bright red blood per  rectum, no melena, no hematuria.  All other systems are negative.   PHYSICAL EXAMINATION:  VITAL SIGNS:  Temperature 99.7, pulse 140, improved  to 109.  Respirations 22, blood pressure 92/52.  She is saturating 100% on 2  liters nasal cannula.  GENERAL:  She is an elderly female, chronically ill appearing, lying in bed  in no acute distress.  HEENT: Normocephalic and atraumatic.  NECK:  JVP is not elevated.  CARDIOVASCULAR:  Normal S1 and S2.  Irregularly irregular, tachycardia.  She  has a systolic murmur at the right upper sternal border. Pulses are 2+  throughout.  LUNGS: Clear with occasional rhonchi noted.  ABDOMEN: Soft.  Positive bowel sounds. Nontender.  She has ecchymosis on the lower half of her abdomen.  She  has a protuberant abdomen from her hematoma.  EXTREMITIES:  She has trace to 1+ edema which is improved from before.  NEUROLOGIC: Nonfocal.   Chest x-ray shows cardiomegaly with no evidence of CHF.  She has COPD  present.   EKG: Rate of 102, atrial fibrillation, left bundle branch block.   Labs: White count 7.6, creatinine 32.2, platelets 311.  Creatinine 1,  potassium 4.5, glucose 115.  INR 1.  Point-of-care cardiac enzymes: CK-MB  less than 1, troponin less than 0.05.   ASSESSMENT AND PLAN:  The patient is a very pleasant 77 year old female with  recent diagnosis of atrial fibrillation pharmacologically converted with  amiodarone, now with recurrence with atrial fibrillation and rapid  ventricular response and symptomatic with fatigue.  1.  Atrial fibrillation with rapid ventricular response.  We will rate      control her by restarting Toprol which she was previously on.  I will      start it at 25 mg p.o. twice daily.  We will continue amiodarone.  This      dose may need to be adjusted as she is on a low dose currently.  She is      not anticoagulated with Coumadin, and I will not start heparin currently      due to her recent perirectal sheath hematoma.  We will continue      anticoagulation with aspirin.  We will monitor her closely and see if      she converts on her own.  If not, then we may consider direct current      cardioversion as her symptom recurrence is of short duration.  2.  Hypothyroidism.  She is on Synthroid.  Her dose was recently decreased      during this last hospitalization.  We will monitor her thyroid closely      as she is on amiodarone.  3.  Congestive heart failure.  She appears euvolemic currently.  We will      continue her low-dose Diovan and Lasix.  We will continue a statin.  4.  Recent bleed.  Her hematocrit appears stable currently.  She has      ecchymosis present.  The hematoma appears soft and improving.  5.  Dyslipidemia.  Will continue  Zocor. She recently had a lipid panel check      in her hospitalization.           ______________________________  Lorain Childes, M.D. [facility]     CGF/MEDQ  D:  09/08/2005  T:  09/08/2005  Job:  [id number]

## 2010-10-12 NOTE — H&P (Signed)
[name], [name]:  [id number]   MEDICAL RECORD NO.:  [id number]          PATIENT TYPE:  EMS   LOCATION:  MAJO                         FACILITY:  [facility]   PHYSICIAN:  Jonelle Sidle, M.D. LHCDATE OF BIRTH:  1925-08-06   DATE OF ADMISSION:  08/26/2005  DATE OF DISCHARGE:                                HISTORY & PHYSICAL   PRIMARY CARE PHYSICIAN:  Dr. Merla Riches, [facility].   PRIMARY CARDIOLOGIST:  Charlies Constable, M.D.   REASON FOR ADMISSION:  Newly diagnosed atrial fibrillation with rapid  ventricular response.   HISTORY OF PRESENT ILLNESS:  [name] is a 77 year old woman with a history  of hypothyroidism, hypertension, hyperlipidemia, chronic intraventricular  conduction delay of left bundle branch block type, and previously documented  nonischemic cardiomyopathy with an ejection fraction in the 40-45% range and  no significant coronary artery disease by catheterization in 1994.  She saw  Dr. Juanda Chance last in the office in 2004.  She is now referred to the emergency  department from [facility] with findings of atrial  fibrillation with rapid ventricular response.  Her history is a bit  difficult to sort through.  She is here with her daughter and as best I can  ascertain has a subacute to chronic history of cough lasting at least for  several months and perhaps over a year.  She on the one hand denies having  any sense of palpitations, but on the other hand, does state that she felt  her heart rate being rapid at least as of Friday with an increased sense  of shortness of breath at rest.  She has not had any specific chest pain.  Strips available from [facility] show probable atrial  fibrillation with rapid ventricular response up into the 160s, although some  strips show perhaps an ectopic atrial tachycardia with variable conduction.  Repeat tracing here in the emergency department shows  coarse atrial  fibrillation at a rate of 103 beats per minute with occasional ectopic beats  versus premature ventricular complexes.   There is no previously documented history of atrial arrhythmias based on the  information.  There has been no obvious syncope or bleeding problems.  There  is some question as to whether the patient has been on her medications  regularly at home.  She does not know her medication doses and her daughter  is concerned about the patient's memory.  I do note a history of previous  breast cancer, apparently status post radiation and chemotherapy.  I do not  see a recent chest x-ray.   ALLERGIES:  Reported as PENICILLIN.   MEDICATIONS AT HOME:  Appear to include Aricept, Synthroid, Toprol XL,  hydrochlorothiazide, Lipitor, and Namenda.  Doses are not certain.  The  patient was treated with intravenous Cardizem by EMS with some improvement  in heart rate.   PAST MEDICAL HISTORY:  As outlined in the history of present illness.  I  note a myocardial profusion study from April of 2004 revealing an ejection  fraction of 44% with global hypokinesis  and mild apical thinning.  Last  echocardiogram at that time revealed an ejection fraction of 40-45%.   SOCIAL HISTORY:  The patient lives in [place].  She is a widow.  She  lives alone and is retired.  There is no reported tobacco or alcohol use  history.   FAMILY HISTORY:  Significant for myocardial infarction in the patient's  mother who died at age 20.  The patient's father died with myocardial  infarction and congestive heart failure at age 63.   REVIEW OF SYSTEMS:  As described in the history of present illness.  She has  had some rhinorrhea that has been chronic.  She uses glasses for reading.  She has a chronic history of cough that is essentially nonproductive.  There  is a questionable history of pulmonary fibrosis, although I do not see firm  records in this regard.   PHYSICAL EXAMINATION:  VITAL  SIGNS:  Temperature is 98.7 degrees, heart rate  110-130 in atrial fibrillation.  Systolic blood pressure ranging from 90-  119, respirations 22, oxygen saturation is 100% on 2 L nasal cannula.  GENERAL:  This is a pleasant, elderly woman in no acute distress.  The  patient's daughter is present.  NECK:  Reveals no elevated jugular venous pressure or loud bruits.  LUNGS:  Exhibit diminished breath sounds with some rhonchorous changes but  no obvious egophony or wheezes.  CARDIAC:  Reveals an irregularly irregular rhythm without S3 gallop of loud  murmur.  ABDOMEN:  Soft with present bowel sounds.  No obvious hepatomegaly.  EXTREMITIES:  Show no significant pitting edema.   LABORATORY DATA:  WBC is 3.9, hemoglobin 11.7, hematocrit 34.1, platelets  174.  INR is 1.1.  Sodium 142, potassium 3.7, chloride 107, bicarb 28,  glucose 118, BUN 14, creatinine 0.9.  CK 62, CK-MB 1.5, troponin I 0.01.   IMPRESSION:  1.  Newly diagnosed atrial fibrillation of uncertain duration.  It is      possible based on symptom exacerbation that this has been present since      Friday.  It is also possible that this has been intermittent for quite      some time.  2.  History of nonischemic cardiomyopathy based on available information      with prior ejection fraction documented in the 40-45% range in 2004 and      Myoview without ischemia at that time.  Remote cardiac catheterization      in 1994 revealed no significant coronary artery disease.  The patient      was last seen by Dr. Juanda Chance in 2004.  3.  Chronic aggressive cough, nonproductive.  There is a questionable      history of pulmonary fibrosis based on very limited information.  There      is also a history of previous breast cancer, status post chemotherapy      and radiation therapy.  Chest x-ray is pending.  4.  History of hypothyroidism apparently on Synthroid.  5.  Hypertension.  6.  Hyperlipidemia.   PLAN: 1.  The patient will be admitted  to telemetry.  2.  We will plan to give her a fluid bolus to help stabilize blood pressure      and begin low dose Cardizem drip along with intravenous digoxin.  Once      heart rate has come under better control, plan will be to repeat an      echocardiogram to reassess left ventricular function.  3.  Continue to cycle cardiac markers and follow up on TSH.  4.  Will follow up on chest x-ray.  I would anticipate given her long term      coughing, that a chest CT may be also a reasonable next step.  5.  The patient will be started on heparin.  Dr. Juanda Chance will evaluate the      patient and decisions can be made regarding candidacy for Coumadin and      whether an attempt at cardioversion (likely with a transesophageal      echocardiogram) would be reasonable.  6.  The patient's daughter will obtain her home medications and we can      review the doses.  7.  Further plan is to follow.           ______________________________  Jonelle Sidle, M.D. [facility]     SGM/MEDQ  D:  08/26/2005  T:  08/26/2005  Job:  [id number]   cc:   Dr. Merla Riches  [facility]   Charlies Constable, M.D. [facility]  [address]  [address]  [place]  [place] [address]

## 2010-10-12 NOTE — Consult Note (Signed)
NAMESCOTLAND, DOST            ACCOUNT NO.:  [id number]   MEDICAL RECORD NO.:  [id number]          PATIENT TYPE:  INP   LOCATION:  6523                         FACILITY:  [facility]   PHYSICIAN:  Casimiro Needle B. Sherene Sires, M.D. [facility] OF BIRTH:  February 15, 1926   DATE OF CONSULTATION:  08/27/2005  DATE OF DISCHARGE:                                   CONSULTATION   REASON FOR CONSULTATION:  Cough/pulmonary nodule.   HISTORY OF PRESENT ILLNESS:  A 77 year old white female with cough dating  back at least several years in the hospital now for acute worsening dyspnea  over the last two months.  The cough has been nonproductive, mostly daytime,  and only relieved by Robitussin not by inhalers including Combivent.  She  was seen at Urgent Care with the recommendation that her long-term ACE  inhibitor be stopped but this was on the 2nd and I was asked to see her  after a workup for dyspnea and mostly dry cough with no variability with  season or environmental change which revealed a pulmonary nodule.   The patient does not have history of hemoptysis, pleuritic pain or  unattended weight loss.  She does have dyspnea now with anything more than  slow ADLs, also orthopnea and has a history of known nonischemic  cardiomyopathy with an ejection fraction baseline of 40-45% by  echocardiogram August 10, 2002.  She was found to be in atrial fibrillation  and cardiac workup is in progress.   PAST MEDICAL HISTORY:  1.  Significant for breast cancer 10 years ago for which she said she was      told she had no nodes but underwent both chemotherapy IV and radiation      therapy.  2.  Hypothyroidism.  3.  Hypertension.  4.  Hyperlipidemia.  5.  Chronic venous insufficiency.  6.  Left bundle branch block.   ALLERGIES:  PENICILLIN nonspecific.   OUTPATIENT MEDICATIONS:  Aricept, Synthroid, Toprol, hydrochlorothiazide,  Enalapril (until August 26, 2005 when it was stopped by her Urgent Care  doctor), Lipitor,  Namenda, and possibly Levatol.   SOCIAL HISTORY:  She has never smoked but has been exposed passively both to  her husband and coworkers.  She has no unusual child, pet or hobby exposure  history.   FAMILY HISTORY:  Positive for ischemic heart disease in mother, heart  failure in father.  No lung cancer in the family.   REVIEW OF SYSTEMS:  Taken in detail from the patient and also reviewed.  Negative for reflux or sinus complaints.   PHYSICAL EXAMINATION:  This is an elderly white female slightly older than  her stated age with weathered faces.  She has afebrile, normal vital signs.  HEENT is unremarkable.  Pharynx is clear.  Dentition is intact.  Neck is  supple without cervical adenopathy or tenderness.  Lung fields reveal a few  crackles on inspiration bilaterally associated with coughing.  She has  dullness to the right greater than the left base.  There is an irregular  rhythm with a 1/6 systolic murmur.  Abdomen is soft and benign with no  palpable organomegaly, masses or tenderness. Extremities are warm without  calf tenderness, cyanosis or clubbing.  There was 1+ pitting edema.  Neurologic shows no focal deficits or pathologic reflexes.  Skin exam was  warm and dry.   Chest x-ray on admission was normal.  However, a CT scan suggest a solitary  pulmonary nodule on the lingula and also right greater than left pleural  effusion along with apical scarring bilaterally.  BNP was elevated in the  800 range on admission.  Otherwise lab studies were unremarkable.   IMPRESSION:  1.  Chronic cough is classic for ACE inhibitors and it has been present for      two years, dry in nature, day greater than night and at this point it is      an ACE induced cough until proven otherwise.  I would recommend continue      off of ACE inhibitors for at least six weeks and treating aggressively      for cyclical cough with a combination of tramadol and Protonix.      Tramadol to control excessive  coughing and Protonix to prevent secondary      reflux from coughing which typically is associated with cyclical cough.      I do not see any evidence for interstitial lung disease and even if she      had it I would think it would be more likely related to interstitial      edema than fibrosis or an inflammatory process, but I would check the      sedimentation rate to be complete.  2.  Dyspnea, orthopnea, and leg swelling along with elevated BNP are all      consistent with congestive heart failure which is decompensated I      believe related to atrial arrhythmias.  She also has associated right      greater than left pleural effusions which are consistent with CHF.  I      have recommended treatment by cardiology but no further pulmonary      evaluation at this point for this problem until her cardiac status is      optimized.  3.  Salivary pulmonary nodule on the lingula, not seen on plain film.      Therefore, old x-rays not going to be helpful here.  She does have      positive smoking exposure and also a history of breast cancer with      radiation and chemotherapy required 10 years ago as risk factors for the      possibility that this may represent malignancy.  A PET scan would be      reasonable at this point but she is not a candidate for any form of      immediate surgery and the decision needs to be made either to do an      excisional biopsy or simply follow conservatively.  Attempting a biopsy      otherwise is not helpful from a risk/benefit prospective.  We can      certainly follow this lesion as an outpatient even if it is positive on      PET scan because she is not an immediate surgical candidate to see to      what extent her overall health improves with optimization of problem #2.           ______________________________  Charlaine Dalton. Sherene Sires, M.D. [facility]     MBW/MEDQ  D:  08/27/2005  T:  08/29/2005  Job:  [id number]  cc:   Merla Riches, M.D.  Urgent Care   Charlies Constable, M.D. [facility]  [address]  [address]  [place]  [place] [address]

## 2010-10-12 NOTE — Discharge Summary (Signed)
NAMESHARIKA, MOSQUERA NO.:  [id number]   MEDICAL RECORD NO.:  [id number]          PATIENT TYPE:  INP   LOCATION:  2003                         FACILITY:  [facility]   PHYSICIAN:  Charlies Constable, M.D. [facility] DATE OF BIRTH:  May 24, 1926   DATE OF ADMISSION:  09/08/2005  DATE OF DISCHARGE:  09/10/2005                                 DISCHARGE SUMMARY   DISCHARGE DIAGNOSES:  1.  Paroxysmal atrial fibrillation, currently normal sinus rhythm on      amiodarone. Patient's initial heart rate on arrival to the emergency      room was 140. Electrocardiogram showed atrial fibrillation with rapid      ventricular response. The patient was restarted on her Toprol at 25 mg      p.o. b.i.d. We continued her amiodarone anticoagulation therapy was      continued with aspirin as patient would not be a candidate for Coumadin      or heparin due to her recent perirectal sheath hematoma.  2.  Hypothyroidism. Patient currently on Synthroid, TSH level this admission      was within normal range at 1.749.  3.  Congestive heart failure.  4.  The patient was euvolemic. During this hospitalization we continued her      low dose Diovan and Lasix.  5.  Recent bleed. Hematocrit this admission stable at 9.6.  6.  Dyslipidemia. Patient continued on her Zocor.   PAST MEDICAL HISTORY:  1.  Atrial fibrillation diagnosed May 30, 2005. Status post attempt at DC      cardioversion with 100 joules which converted her to normal sinus rhythm      transiently but patient reverted back to atrial fibrillation. The      patient was begun on amiodarone during the hospitalization and converted      to normal sinus rhythm. She initially was anticoagulated with heparin      and Coumadin, however this was stopped because patient had a perirectal      hematoma which required 2 units packed red blood cells. The decision was      made at that point to keep patient off Coumadin and maintain her      anticoagulation  therapy with aspirin at 325 mg daily. The patient also      had a TEE during the hospitalization in January that was negative for      left atrial thrombus. Her ejection fraction was noted to be 40%.  2.  Hypothyroidism.  3.  Perirectal hematoma.  4.  Cirrhosis noted on CT.  5.  History of breast cancer status post radiation and chemotherapy. The      patient has possible lung nodules noted during her last hospitalization.      She had FDP uptake noted in these nodules. Oncology was consulted.      Patient she is going to have further outpatient work up by oncology.  6.  Heart failure with ejection fraction of 30-40%, status post cardiac      catheterization in 1994 which revealed non obstructive disease.  7.  Chronic renal insufficiency.  8.  Left bundle branch block.  9.  Home oxygen at 2 liters nasal cannula.  10. Dyslipidemia.   HOSPITAL COURSE:  Ms. Kanan is a 77 year old Caucasian female with recent  hospitalization for a prolonged period of time for recent admission for  atrial fibrillation/perirectal hematoma. The patient was discharged home on  September 05, 2005. On the day of admission patient was at home, states she was  feeling well. Home Health nurse was visiting and noticed patient's pulse was  irregular at a rate in the 130's. The patient later began to feel fatigued.  Her pulse continued to irregular however and she presented to [facility]  emergency room.  EKG done at that time showed atrial fibrillation with rapid  ventricular response. Chest x-ray showed cardiomegaly without evidence of  CHF. COPD was noted on EKG at a rate of 102 and atrial fibrillation with a  left bundle branch block. Cardiac enzymes were negative. The patient was  admitted, restarted on her Toprol. The patient returned to normal sinus  rhythm. The patient's heart rate dropped in the 50's at times. Lopressor  dose was discontinued. Dr. Juanda Chance felt patient might need a pacemaker as  recurrent  bradycardia in setting of paroxysmal atrial fibrillation.   The patient is being discharged home on September 10, 2005 after having been  seen by Dr. Juanda Chance.   DISCHARGE MEDICATIONS:  1.  Amiodarone has been increased to 200 mg daily.  2.  Diovan has been increased to 40 mg p.o. b.i.d.  3.  Synthroid has been decreased to 50 mcg daily.  4.  Toprol XL 12.5 mg daily.  5.  Patient to continue Aricept as previously taken.  6.  Zocor 80 mg at h.s.  7.  Aspirin 325 mg daily.  8.  Lasix 20 mg daily.  9.  K-Dur 20 mEq daily.  10. Protonix 40 mg daily.  11. Ensure as previously used as a nutritional supplement.   FOLLOW UP:  The patient is to follow up with Dr. Charlies Constable September 18, 2005  at 8:30 a.m.   DURATION OF DISCHARGE ENCOUNTER:  40 minutes.      Dorian Pod, NP    ______________________________  Charlies Constable, M.D. [facility]    MB/MEDQ  D:  09/10/2005  T:  09/10/2005  Job:  [id number]   cc:   Charlaine Dalton. Sherene Sires, M.D. [facility]  [address]  [place]  [place] [address]   Harrel Lemon. Merla Riches, M.D.  Fax: [phone]

## 2010-10-12 NOTE — Discharge Summary (Signed)
NAMEBEREA, MAJKOWSKI NO.:  [id number]   MEDICAL RECORD NO.:  [id number]          PATIENT TYPE:  OUT   LOCATION:  XRAY                         FACILITY:  [facility]   PHYSICIAN:  Joellyn Rued, P.A. [facility] DATE OF BIRTH:  06-Feb-1926   DATE OF ADMISSION:  08/28/2005  DATE OF DISCHARGE:  08/28/2005                           DISCHARGE SUMMARY - REFERRING   ADDENDUM TO DICTATION # 272536:  Discharge time greater than 30 minutes.      Joellyn Rued, P.A. [facility]     EW/MEDQ  D:  09/06/2005  T:  09/06/2005  Job:  [id number]

## 2010-10-12 NOTE — Consult Note (Signed)
Brianna Robertson, Brianna Robertson            ACCOUNT NO.:  [id number]   MEDICAL RECORD NO.:  [id number]          PATIENT TYPE:  OUT   LOCATION:  XRAY                         FACILITY:  [facility]   PHYSICIAN:  Brianna Robertson, M.D.DATE OF BIRTH:  07-May-1926   DATE OF CONSULTATION:  08/29/2005  DATE OF DISCHARGE:                                   CONSULTATION   ONCOLOGY/HEMATOLOGY CONSULTATION   REFERRING PHYSICIAN:  Charlies Robertson, M.D.   DATE OF CONSULTATION:  August 29, 2005   REASON FOR REFERRAL:  Patient with pulmonary nodule and history of breast  cancer, assist with evaluation.   FINDINGS:  The patient is a 77 year old woman with a past medical history  significant for early stage left breast cancer (node-negative, ER negative,  PR negative) diagnosed in June 1995. She was treated with chemotherapy and  radiation in [place], [place].   She was admitted for evaluation of chronic cough and worsening dyspnea.  A  chest x-ray demonstrated a pulmonary nodule in the lingula.  On August 27, 2005 a chest CT angiogram demonstrated a 10 X 14 mm nodule in the left upper  lobe near the cardiac apex with bilateral pleural effusion, the right  greater than left.  There was no evidence of pulmonary embolism.  The  patient then went on to have a whole body PET scan on August 28, 2005 which  demonstrated increased activity within this nodule with an SUV of 4.2, and  also abnormal uptake along the axis of right clavicle concerning for bone  metastases.  There was no evidence of lymphadenopathy within the neck,  chest, abdomen or pelvis.  There was also no abnormal uptake within the  abdomen or pelvis.  The patient's hospital course was complicated by atrial  fibrillation.  She failed TEE cardioversion today and remains on heparin.  Plans from the cardiac standpoint are hopefully to cardiovert the patient  with placement on Coumadin for at least six weeks.  Oncology has been  consulted to assist with  evaluation of pulmonary nodule.   REVIEW OF SYSTEMS:  The patient admits to a 30 pound weight loss over a year  associated with anorexia.  She also complains of dumping syndrome and her  last colonoscopy late last year was negative.  Besides the cough and  shortness of breath on exertion, she denies hemoptysis, and chest pain.   PAST MEDICAL HISTORY:  1.  Breast cancer diagnosed in June 1995, status post lumpectomy,      chemotherapy, and XRT.  Treatment received in [place], [place].  2.  Hypothyroidism.  3.  Hypertension.  4.  History of cardiomyopathy, ejection fraction of 45%.  5.  Hypercholesterolemia.  6.  Chronic venous insufficiency.  7.  Spinal DJD.   IN HOUSE MEDICATIONS:  1.  Coreg.  2.  Aricept.  3.  Heparin.  4.  Coumadin.  5.  Synthroid.  6.  Protonix.  7.  Zocor.  8.  Ultram p.r.n..  9.  Fentanyl.  10. Xanax.  11. Amiodarone.   ALLERGIES:  The patient is ALLERGIC to PENICILLIN.   SOCIAL HISTORY:  The patient  resides in [place].  She is a widow with two  grown-up daughters; one lives here and the other lives in [place]. 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  She denies history of tobacco or alcohol use.   FAMILY HISTORY:  Significant for ischemic heart disease.  There is no family  history for oncologic malignancies.   REVIEW OF SYSTEMS:  As above.  12 point review of systems is essentially  negative.   PHYSICAL EXAMINATION:  GENERAL APPEARANCE:  The patient is a well-appearing  woman currently in no distress.  VITAL SIGNS:  Pulse 78, blood pressure 124/60, temperature 97, respirations  24.  Weight 184 pounds.  HEENT:  Head is normocephalic, atraumatic.  Extraocular movements intact.  Sclerae anicteric.  Mouth moist, no thrush, ulcerations or lesions.  NECK:  Supple without thyromegaly and trachea is center.  CHEST:  Reveals decreased breath sounds in both bases, otherwise clear.  CARDIOVASCULAR:   Examination demonstrates irregular heart rate with systolic  ejection murmur.  ABDOMEN:  Soft, nontender.  There is no hepatosplenomegaly and bowel sounds  were present.  LYMPH NODES:  No palpable cervical, axillary, inguinal adenopathy.  BREAST:  Examination demonstrates a left lumpectomy.  Both breasts revealed  no dominant masses or nipple discharge.  EXTREMITIES:  Reveal trace edema.  CNS/PNS:  Reveal no focal deficits. Plantar's were present and downgoing.   LABORATORY DATA:  White blood cell count 4.7, hemoglobin 11.3, hematocrit  32.6, platelet count 191,000.  Sodium 137, potassium 3.6, chloride 103, cO2  28, BUN 5, creatinine 0.9, glucose 105.  Total bilirubin 1.6. AST 18, ALT  21. Pro Time 15.1, INR 1.2.  Results of staging CT scan and PET scan are as in findings.   IMPRESSION AND PLAN:  1.  Atrial fibrillation with fast ventricular rate, status post failed TEE      cardioversion.  2.  Left upper lobe pulmonary nodule with bilateral pleural effusions with      possibility of right clavicular bony metastases.  Patient has history of      left breast cancer diagnosed over 10 years ago.  From an oncology      standpoint, will require tissue diagnosis, preferably with the      assistance of cardio-thoracic surgeons due to the location.  However,      with underlying history of cardiomyopathy (ejection fraction of 40%),      and current atrial fibrillation, would suggest stabilizing the patient's      cardiac status before proceeding with biopsy.  Brianna Robertson tells me that      this will include repeat cardioversion and Coumadin for at least six      weeks.  In the meantime,      would recommend a bone scan, CA27-29 and CT scan of the abdomen and      pelvis.  I met and discussed the patient's case with the patient and her      daughter's.  They concur with the recommendations.   Thank you for this referral.  Please call for questions.      Brianna Robertson,  M.D. Electronically Signed     LIO/MEDQ  D:  08/29/2005  T:  08/29/2005  Job:  [id number]

## 2010-10-12 NOTE — Assessment & Plan Note (Signed)
[facility]                              CARDIOLOGY OFFICE NOTE   Brianna, Robertson                   MRN:          [id number]  DATE:12/11/2005                            DOB:          23-May-1926    PRIMARY CARE PHYSICIAN:  Dr. Artist Pais   CLINICAL HISTORY:  Brianna Robertson is 77 years old and returns for follow-up  management of her atrial fibrillation and congestive heart failure.  She was  admitted to the hospital with congestive heart failure and atrial  fibrillation.  Underwent cardioversion and has been maintained on  amiodarone.  She was not put on Coumadin because she developed a spontaneous  retroperitoneal bleed in the hospital.  Her echocardiogram initially showed  an ejection fraction 30% but after controlling her rhythm her last follow-up  echocardiogram showed an ejection fraction of 50%.  She has been doing  fairly well from the standpoint of her heart recently and has been having no  palpitations, chest pain, and minimal shortness of breath.   She also was found to have a pulmonary nodule on PET scan within the  hospital and she has been seen in [place] for this and our report is that  she had a CT scan which showed a nodule and they have recommended a needle  biopsy.  Part of the reason she comes today was to get cardiac clearance to  have this done.  My understanding is that this would probably be done  through a bronchoscope.  She has a previous history of breast cancer.  Is  not clear whether this might be a metastatic lesion or a new primary.   She has also had a problem with cough and has required home oxygen.  Cough  has gotten worse with deep inspiration when she goes off the oxygen.  She  was seen in consultation by Dr. Sherene Sires.  He was not sure of the etiology, but  thought it might be related to some congestion and increased her Lasix for a  few days and also recommended that she take a cough medicine.  She has not  done  this yet but I encouraged her to do that.   PAST MEDICAL HISTORY:  Significant for hypothyroidism.   CURRENT MEDICATIONS:  Potassium, furosemide, Synthroid, amiodarone, Diovan,  Toprol, aspirin, simvastatin, Aricept, and Pepcid.   PHYSICAL EXAMINATION:  VITAL SIGNS:  Blood pressure 128/65, pulse 64 and  regular.  NECK:  There was no venous distention.  The carotid pulses were full without  bruits.  CHEST:  Clear.  CARDIAC:  Rhythm was regular.  There was a short systolic murmur left  sternal edge.  ABDOMEN:  Soft without organomegaly.  EXTREMITIES:  There was nonpitting edema in the lower extremities.  Pedal  pulses were equal.   IMPRESSION:  1.  Recent atrial fibrillation, now in sinus rhythm and controlled on      amiodarone.  2.  Non-ischemic cardiomyopathy with ejection fraction 30%, now improved to      50%.  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  3.  Congestive heart failure, now compensated.  4.  History of  hypoxia requiring home oxygen.  5.  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  6.  Rectus sheath hematoma and retroperitoneal hematoma while on Coumadin,      resolved, now not on Coumadin therapy.  7.  History of breast cancer with pulmonary lesion on CT with plans for a      needle biopsy.  8.  Hyperlipidemia.  9.  Dementia.  10. Skin rash.  Not certain if medication related, but possibly related to      amiodarone.   RECOMMENDATIONS:  I spoke with Ms. Cancel daughter, Juliette Alcide, and also with  her daughter, Misty Stanley, who is with her today.  I think they will plan to do the  needle biopsy here in [place] since that is easier for [place].  I think  from a cardiac standpoint she is okay to have that done.  They are concerned  that the medications might be contributing to their dementia and the only  one that might, I think, is the Toprol.  She is on a very low dose of this  and will plan to stop this for a few weeks and if her memory improves we  will leave her  off of it.  If there is no change then we will resume the  Toprol.  She was told that the Diovan was being discontinued by [employer] at [employer] and we will check on that.  The skin rash is not  generalized and I think is probably not a drug rash but it does appear in  the exposed areas and could be a photosensitive reaction to amiodarone.  I  will be interested in Dr. Scharlene Gloss opinion.  If he thinks this is a  possibility then we may have to consider stopping the amiodarone or finding  a substitute, although I think this has done a great job in controlling her  heart rhythm which I think is responsible for her cardiac improvement.  I  will plan to see her back in follow-up in three months.  She has a follow-up  to see Dr. Sherene Sires in the next few months.  I encouraged her to give the cough  medicine that he recommended a try so that he can assess that when she sees  him back.                               Bruce Elvera Lennox Juanda Chance, MD, [facility]    BRB/MedQ  DD:  12/11/2005  DT:  12/11/2005  Job #:  [id number]

## 2010-10-24 ENCOUNTER — Ambulatory Visit (INDEPENDENT_AMBULATORY_CARE_PROVIDER_SITE_OTHER): Payer: Medicare Other | Admitting: *Deleted

## 2010-10-24 ENCOUNTER — Encounter: Payer: Medicare Other | Admitting: *Deleted

## 2010-10-24 DIAGNOSIS — I4891 Unspecified atrial fibrillation: Secondary | ICD-10-CM

## 2010-10-24 LAB — POCT INR: INR: 4.4

## 2010-11-01 ENCOUNTER — Other Ambulatory Visit: Payer: Self-pay | Admitting: Cardiology

## 2010-11-05 ENCOUNTER — Other Ambulatory Visit: Payer: Self-pay | Admitting: *Deleted

## 2010-11-05 MED ORDER — POTASSIUM CHLORIDE CRYS ER 20 MEQ PO TBCR: 20.0000 meq | EXTENDED_RELEASE_TABLET | Freq: Every day | ORAL | Status: DC

## 2010-11-07 ENCOUNTER — Ambulatory Visit (INDEPENDENT_AMBULATORY_CARE_PROVIDER_SITE_OTHER): Payer: Medicare Other | Admitting: *Deleted

## 2010-11-07 DIAGNOSIS — I4891 Unspecified atrial fibrillation: Secondary | ICD-10-CM

## 2010-11-07 LAB — POCT INR: INR: 3.9

## 2010-11-21 ENCOUNTER — Ambulatory Visit (INDEPENDENT_AMBULATORY_CARE_PROVIDER_SITE_OTHER): Payer: Medicare Other | Admitting: *Deleted

## 2010-11-21 DIAGNOSIS — I4891 Unspecified atrial fibrillation: Secondary | ICD-10-CM

## 2010-11-21 LAB — POCT INR: INR: 2.5

## 2010-12-05 ENCOUNTER — Ambulatory Visit (INDEPENDENT_AMBULATORY_CARE_PROVIDER_SITE_OTHER): Payer: Medicare Other | Admitting: *Deleted

## 2010-12-05 DIAGNOSIS — I4891 Unspecified atrial fibrillation: Secondary | ICD-10-CM

## 2010-12-05 LAB — POCT INR: INR: 2

## 2010-12-19 ENCOUNTER — Ambulatory Visit (INDEPENDENT_AMBULATORY_CARE_PROVIDER_SITE_OTHER): Payer: Medicare Other | Admitting: *Deleted

## 2010-12-19 ENCOUNTER — Encounter: Payer: Medicare Other | Admitting: *Deleted

## 2010-12-19 DIAGNOSIS — I4891 Unspecified atrial fibrillation: Secondary | ICD-10-CM

## 2010-12-19 LAB — POCT INR: INR: 3.3

## 2010-12-24 ENCOUNTER — Other Ambulatory Visit: Payer: Self-pay | Admitting: Cardiology

## 2010-12-27 ENCOUNTER — Other Ambulatory Visit: Payer: Self-pay | Admitting: Cardiology

## 2011-01-09 ENCOUNTER — Ambulatory Visit (INDEPENDENT_AMBULATORY_CARE_PROVIDER_SITE_OTHER): Payer: Medicare Other | Admitting: *Deleted

## 2011-01-09 DIAGNOSIS — I4891 Unspecified atrial fibrillation: Secondary | ICD-10-CM

## 2011-01-09 LAB — POCT INR: INR: 2.8

## 2011-01-21 ENCOUNTER — Other Ambulatory Visit: Payer: Self-pay | Admitting: Cardiology

## 2011-02-01 ENCOUNTER — Telehealth: Payer: Self-pay | Admitting: Cardiology

## 2011-02-01 NOTE — Telephone Encounter (Signed)
Dr. Duaine Dredge aware that Dr. Shirlee Latch is not office until Monday. Would like for him to call to discuss patient care.

## 2011-02-04 NOTE — Telephone Encounter (Signed)
I have faxed order to Cardiac Rehab at [facility].

## 2011-02-04 NOTE — Telephone Encounter (Signed)
Dr Shirlee Latch talked with Dr Duaine Dredge. Dr Shirlee Latch will order cardiac rehab for pt.

## 2011-02-06 ENCOUNTER — Ambulatory Visit (INDEPENDENT_AMBULATORY_CARE_PROVIDER_SITE_OTHER): Payer: Medicare Other | Admitting: *Deleted

## 2011-02-06 DIAGNOSIS — I4891 Unspecified atrial fibrillation: Secondary | ICD-10-CM

## 2011-02-06 LAB — POCT INR: INR: 1.3

## 2011-02-13 ENCOUNTER — Ambulatory Visit (INDEPENDENT_AMBULATORY_CARE_PROVIDER_SITE_OTHER): Payer: Medicare Other | Admitting: *Deleted

## 2011-02-13 DIAGNOSIS — I4891 Unspecified atrial fibrillation: Secondary | ICD-10-CM

## 2011-02-13 LAB — POCT INR: INR: 1.5

## 2011-02-19 ENCOUNTER — Other Ambulatory Visit: Payer: Self-pay | Admitting: Cardiology

## 2011-02-19 ENCOUNTER — Encounter: Payer: Self-pay | Admitting: Internal Medicine

## 2011-02-19 DIAGNOSIS — R0989 Other specified symptoms and signs involving the circulatory and respiratory systems: Secondary | ICD-10-CM

## 2011-02-20 ENCOUNTER — Other Ambulatory Visit: Payer: Self-pay | Admitting: Family Medicine

## 2011-02-20 ENCOUNTER — Encounter (INDEPENDENT_AMBULATORY_CARE_PROVIDER_SITE_OTHER): Payer: Medicare Other | Admitting: Cardiology

## 2011-02-20 ENCOUNTER — Ambulatory Visit (INDEPENDENT_AMBULATORY_CARE_PROVIDER_SITE_OTHER): Payer: Medicare Other | Admitting: *Deleted

## 2011-02-20 DIAGNOSIS — I712 Thoracic aortic aneurysm, without rupture: Secondary | ICD-10-CM

## 2011-02-20 DIAGNOSIS — R0989 Other specified symptoms and signs involving the circulatory and respiratory systems: Secondary | ICD-10-CM

## 2011-02-20 DIAGNOSIS — I6529 Occlusion and stenosis of unspecified carotid artery: Secondary | ICD-10-CM

## 2011-02-20 DIAGNOSIS — I4891 Unspecified atrial fibrillation: Secondary | ICD-10-CM

## 2011-02-20 LAB — POCT INR: INR: 1.6

## 2011-02-21 ENCOUNTER — Ambulatory Visit: Payer: Medicare Other

## 2011-02-27 ENCOUNTER — Ambulatory Visit (INDEPENDENT_AMBULATORY_CARE_PROVIDER_SITE_OTHER): Payer: Medicare Other | Admitting: *Deleted

## 2011-02-27 ENCOUNTER — Ambulatory Visit
Admission: RE | Admit: 2011-02-27 | Discharge: 2011-02-27 | Disposition: A | Discharge status: Home / Self Care | Payer: Medicare Other | Source: Ambulatory Visit | Attending: Family Medicine | Admitting: Family Medicine

## 2011-02-27 ENCOUNTER — Other Ambulatory Visit: Payer: Self-pay | Admitting: Family Medicine

## 2011-02-27 DIAGNOSIS — IMO0001 Reserved for inherently not codable concepts without codable children: Secondary | ICD-10-CM

## 2011-02-27 DIAGNOSIS — I4891 Unspecified atrial fibrillation: Secondary | ICD-10-CM

## 2011-02-27 DIAGNOSIS — I712 Thoracic aortic aneurysm, without rupture: Secondary | ICD-10-CM

## 2011-02-27 MED ORDER — IOHEXOL 300 MG/ML  SOLN
75.0000 mL | Freq: Once | INTRAMUSCULAR | Status: AC | PRN
  Administered 2011-02-27: 75 mL via INTRAVENOUS

## 2011-03-05 ENCOUNTER — Encounter: Payer: Medicare Other | Admitting: Thoracic Surgery (Cardiothoracic Vascular Surgery)

## 2011-03-11 DIAGNOSIS — I712 Thoracic aortic aneurysm, without rupture: Secondary | ICD-10-CM | POA: Insufficient documentation

## 2011-03-12 ENCOUNTER — Institutional Professional Consult (permissible substitution) (INDEPENDENT_AMBULATORY_CARE_PROVIDER_SITE_OTHER): Payer: Medicare Other | Admitting: Thoracic Surgery (Cardiothoracic Vascular Surgery)

## 2011-03-12 ENCOUNTER — Encounter: Payer: Self-pay | Admitting: Thoracic Surgery (Cardiothoracic Vascular Surgery)

## 2011-03-12 VITALS — BP 77/57 | HR 62 | Resp 18 | Ht 61.0 in | Wt 150.0 lb

## 2011-03-12 DIAGNOSIS — I712 Thoracic aortic aneurysm, without rupture, unspecified: Secondary | ICD-10-CM

## 2011-03-12 DIAGNOSIS — R918 Other nonspecific abnormal finding of lung field: Secondary | ICD-10-CM

## 2011-03-12 NOTE — Progress Notes (Signed)
PCP is Carolyne Fiscal, MD Referring Provider is Blomgren, Janett Labella, MD  Chief Complaint  Patient presents with  . Thoracic Aortic Aneurysm    Referral from Dr Duaine Dredge for eval on ascending aortic aneurysm    HPI: Brianna Robertson is an 77 year old woman sent for consultation by Dr. Mosetta Putt regarding an ascending aortic aneurysm. She has been extensive medical history and multiple significant medical problems. She has a history of bilateral breast cancer and has been followed with CT scans. She was found to have some lung nodules about 5 years ago and has been followed by Dr. Fannie Knee for that period of those remain stable in 2010 he was also noted that she had an asending aortic aneurysm of 4.3 cm. She recently had a CT earlier this month which showed stable lung nodules. The ascending aorta still measured 4.3 cm  centimeters, which was unchanged.  Brianna Robertson says that she feels well and denies any chest pain or shortness of breath. She does have dementia, which is apparently been fairly stable over the last year or 2. Her daughter who is with her says she has not complained to her of any chest pain. Her daughter was concerned that Brianna Robertson had an aneurysm, and wondered if any treatment was indicated   Past Medical History  Diagnosis Date  . Breast cancer 1995  . HTN (hypertension)   . Hypothyroidism   . Atrial fibrillation 08/2005  . Dementia     moderate  . CKD (chronic kidney disease)     mild  . Cardiomyopathy     possibly tachycardia-mediated  . Hyperlipemia   . Pulmonary nodule 06/2006  . LBBB (left bundle branch block)   . Aortic stenosis     mild to moderate  . Mitral regurgitation     moderate to severe    Past Surgical History  Procedure Date  . Breast lumpectomy 1995    bilateral, radiation(node,ER, PR negative)  . Excision of tyroid tumor     presumable benign  . Cataract extraction   . 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 Excision of  right nipple for fibrosis with a giant cell reaction     Family History  Problem Relation Age of Onset  . Pancreatic cancer Brother   . Heart disease    . Cancer    . Arthritis      Social History History  Substance Use Topics  . Smoking status: Never Smoker   . Smokeless tobacco: Not on file  . Alcohol Use: Not on file    Current Outpatient Prescriptions  Medication Sig Dispense Refill  . anastrozole (ARIMIDEX) 1 MG tablet Take 1 mg by mouth daily.        Marland Kitchen aspirin 81 MG tablet Take 81 mg by mouth daily.        . carvedilol (COREG) 12.5 MG tablet take 1 tablet by mouth twice a day  60 tablet  6  . citalopram (CELEXA) 20 MG tablet Take 20 mg by mouth daily.        . digoxin (LANOXIN) 0.125 MG tablet Take one half tablet daily       . donepezil (ARICEPT) 10 MG tablet Take 10 mg by mouth at bedtime as needed.        . furosemide (LASIX) 40 MG tablet take 1 tablet by mouth once daily  30 tablet  6  . levothyroxine (SYNTHROID, LEVOTHROID) 25 MCG tablet Take 25 mcg by  mouth daily.        . memantine (NAMENDA TITRATION PACK) tablet pack Take 5 mg by mouth daily.        . potassium chloride SA (KLOR-CON M20) 20 MEQ tablet Take 1 tablet (20 mEq total) by mouth daily.  30 tablet  3  . simvastatin (ZOCOR) 20 MG tablet Take 20 mg by mouth at bedtime.        Marland Kitchen spironolactone (ALDACTONE) 25 MG tablet Take one half tablet by mouth daily       . tiotropium (SPIRIVA) 18 MCG inhalation capsule Place 18 mcg into inhaler and inhale daily.        . valsartan (DIOVAN) 80 MG tablet Take 80 mg by mouth daily.        Marland Kitchen warfarin (COUMADIN) 2.5 MG tablet Take 1 tablet (2.5 mg total) by mouth as directed.  240 tablet  1    Allergies  Allergen Reactions  . Penicillins   . Sulfonamide Derivatives     Review of Systems: States that her memory is shaky, weight has been stable, less than 10 pound weight loss over the past year. She denies orthopnea or paroxysmal dyspnea. She does have swelling in both  lower extremities chronically and palpitations. Positive cough, and frequent wheezing Per her daughter she does have some memory issues and some depression. No recent change in bowel or bladder habits.   BP 77/57  Pulse 62  Resp 18  Ht 5\' 1"  (1.549 m)  Wt 150 lb (68.04 kg)  BMI 28.34 kg/m2  SpO2 97% Physical Exam: Exam Brianna Robertson is a an 77 year old woman in no acute distress Neurologically she is alert, no focal motor deficits HEENT unremarkable Neck faint bruit versus transmitted murmur bilaterally, no thyromegaly or adenopathy Lungs are clear bilaterally with no wheezing or rales. Cardiac exam is irregularly irregular, there is a 2/6 systolic murmur heard throughout   the precordium, most prominent at right upper sternal border. Abdomen soft nontender no palpable aneurysm Extremities 2+ edema both lower ext, no palpable femoral or popliteal aneurysms    Diagnostic Tests: CT of the chest is reviewed and compared to her CT scan from 2010 2009 and 2007. There are multiple lung nodules the largest measuring 14 x 17 mm and the lingula which has smooth borders and is not changed significantly.  Her descending aorta is enlarged measuring 4.33 cm, it also is unchanged since 2007 at which time it measured approximately 4.3 cm   Impression: Brianna Robertson is an 77 year old woman with a small stable descending aortic aneurysm measuring 4.3 cm in diameter. There has been no real change in this going back to at least 2007. There is no indication for surgery at this time. The appropriate treatment is blood pressure control, which is already being done with Coreg and Diovan.  I had a long discussion with Brianna Robertson and her daughter and reviewed the CT scans with them, they understand that generally excepted recommendation for repair of an ascending aortic aneurysm is 5.5 cm to 6 cm. They do understand there is some risk associated with an aneurysm the size of Brianna Robertson, but that risk is much less  than the risk of surgical intervention given her age and comorbidities.     Plan: Recommended repeat CT scan in one year. I will see her back at that time.*

## 2011-03-13 ENCOUNTER — Ambulatory Visit (INDEPENDENT_AMBULATORY_CARE_PROVIDER_SITE_OTHER): Payer: Medicare Other | Admitting: *Deleted

## 2011-03-13 DIAGNOSIS — I4891 Unspecified atrial fibrillation: Secondary | ICD-10-CM

## 2011-03-13 LAB — POCT INR: INR: 2.8

## 2011-03-27 ENCOUNTER — Encounter: Payer: Self-pay | Admitting: Internal Medicine

## 2011-03-27 ENCOUNTER — Ambulatory Visit (INDEPENDENT_AMBULATORY_CARE_PROVIDER_SITE_OTHER): Payer: Medicare Other | Admitting: Internal Medicine

## 2011-03-27 ENCOUNTER — Other Ambulatory Visit: Payer: Self-pay | Admitting: Cardiology

## 2011-03-27 VITALS — BP 80/50 | HR 60 | Ht 63.0 in | Wt 151.0 lb

## 2011-03-27 DIAGNOSIS — D649 Anemia, unspecified: Secondary | ICD-10-CM

## 2011-03-27 DIAGNOSIS — Z1211 Encounter for screening for malignant neoplasm of colon: Secondary | ICD-10-CM

## 2011-03-27 NOTE — Patient Instructions (Signed)
I do not recommend a routine preventive colonoscopy, as we have discussed today. Please see Dr. Duaine Dredge or  call back as needed, for gastrointestinal problems.  Iva Boop, MD, Clement[facility] Graham

## 2011-03-27 NOTE — Progress Notes (Addendum)
Subjective:    Patient ID: Brianna Robertson, female    DOB: 02/04/1926, 77 y.o.   MRN: [id number]  HPI This delightful elderly woman presents with her daughter discussing possible screening colonoscopy. She has no active GI complaints at this time his except for occasional urgent defecation. That is intermittent and a problem for many years and is not really causing her any great distress. She has never had a colonoscopy. She has no rectal bleeding change in bowel habits either. She does have a mild anemia with hemoglobin in the 12 range with slight decline in hemoglobin over a year from 12.4-12 but it is normocytic. Records from Dr.Blomgren are reviewed.  Past Medical History  Diagnosis Date  . Breast cancer 1995    bilateral  . HTN (hypertension)   . Hypothyroidism   . Atrial fibrillation 08/2005  . Dementia     moderate  . CKD (chronic kidney disease)     mild  . Cardiomyopathy     possibly tachycardia-mediated  . Hyperlipemia   . Pulmonary nodule 06/2006  . LBBB (left bundle branch block)   . Aortic stenosis     mild to moderate  . Mitral regurgitation     moderate to severe  . COPD (chronic obstructive pulmonary disease)   . Thoracic aortic aneurysm   . Depression   . CHF (congestive heart failure)   . Scoliosis   . History of pneumonia 2010  . Insomnia   . Rotator cuff disorder     right  . Hearing loss   . Vitamin D deficiency   . History of transfusion of whole blood    Past Surgical History  Procedure Date  . Breast lumpectomy 1995, 2008    bilateral, radiation(node,ER, PR negative)  . Excision of thyroid tumor 1978    presumable benign  . Cataract extraction     2010 right & lens implant, 2011 left & lens implant  . 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 Excision of right nipple for fibrosis with a giant cell reaction 2011    reports that she has never smoked. She has never used smokeless tobacco. She reports that she does not drink  alcohol or use illicit drugs. family history includes Arthritis in an unspecified family member; Cancer in an unspecified family member; Dementia in her mother and sister; Heart attack in her brother; Heart attack (age of onset:65) in her father; Heart disease in her mother; Liver cancer in her brother; and Other in her brother. Allergies  Allergen Reactions  . Penicillins   . Sulfonamide Derivatives       Current outpatient prescriptions:anastrozole (ARIMIDEX) 1 MG tablet, Take 1 mg by mouth daily.  , Disp: , Rfl: ;  aspirin 81 MG tablet, Take 81 mg by mouth daily.  , Disp: , Rfl: ;  calcium carbonate (OS-CAL) 600 MG TABS, Take 600 mg by mouth 2 (two) times daily with a meal.  , Disp: , Rfl: ;  carvedilol (COREG) 12.5 MG tablet, take 1 tablet by mouth twice a day, Disp: 60 tablet, Rfl: 6 citalopram (CELEXA) 20 MG tablet, Take 20 mg by mouth daily.  , Disp: , Rfl: ;  digoxin (LANOXIN) 0.125 MG tablet, Take one half tablet daily , Disp: , Rfl: ;  donepezil (ARICEPT) 10 MG tablet, Take 10 mg by mouth at bedtime as needed.  , Disp: , Rfl: ;  furosemide (LASIX) 40 MG tablet, take 1 tablet by mouth once daily,  Disp: 30 tablet, Rfl: 6;  KLOR-CON M20 20 MEQ tablet, take 1 tablet by mouth once daily, Disp: 30 tablet, Rfl: 6 levothyroxine (SYNTHROID, LEVOTHROID) 25 MCG tablet, Take 25 mcg by mouth daily.  , Disp: , Rfl: ;  memantine (NAMENDA TITRATION PACK) tablet pack, Take 5 mg by mouth daily.  , Disp: , Rfl: ;  simvastatin (ZOCOR) 20 MG tablet, Take 20 mg by mouth at bedtime.  , Disp: , Rfl: ;  spironolactone (ALDACTONE) 25 MG tablet, Take one half tablet by mouth daily , Disp: , Rfl:  tiotropium (SPIRIVA) 18 MCG inhalation capsule, Place 18 mcg into inhaler and inhale daily.  , Disp: , Rfl: ;  valsartan (DIOVAN) 80 MG tablet, Take 80 mg by mouth daily.  , Disp: , Rfl: ;  vitamin E 400 UNIT capsule, Take 400 Units by mouth 2 (two) times daily.  , Disp: , Rfl: ;  warfarin (COUMADIN) 2.5 MG tablet, Take 2.5 mg  by mouth as directed. Take 2 tablets by mouth as directed , Disp: , Rfl:   Review of Systems  she has had some weight loss thought to be due to diuresis mostly in the last year. Rhinorrhea some palpitations pedal edema and paroxysmal nocturnal dyspnea. Oxygen at night. His chronic cough and mild dyspnea on exertion.     Objective:   Physical Exam  Pleasant elderly white woman no acute distress, able to get on the exam table under her own power Eyes are anicteric         Assessment &  Plan:  ;   #1 Colorectal cancer screening  : I explained the risks and benefits of this procedure, colonoscopy for screening as well as described alternatives such as CT colonoscopy and Hemoccult testing. After this discussion and reviewing all of her comorbidities and the expected benefits of colonoscopy, the patient, her daughter and I came to the conclusion, using my overall recommendation that we would not proceed with a screening colonoscopy. It seems very unlikely though not impossible that performing a colonoscopy now would prolong her life and provide good quality. She and her daughter were accepting of this recommendation. It was also decided not to pursue other screening modalities.  #2 Mild chronic anemia: It is appropriate to continue to monitor this and it is likely multifactorial. No gastrointestinal workup is recommended.

## 2011-04-03 ENCOUNTER — Ambulatory Visit (INDEPENDENT_AMBULATORY_CARE_PROVIDER_SITE_OTHER): Payer: Medicare Other | Admitting: *Deleted

## 2011-04-03 ENCOUNTER — Telehealth: Payer: Self-pay | Admitting: *Deleted

## 2011-04-03 DIAGNOSIS — I4891 Unspecified atrial fibrillation: Secondary | ICD-10-CM

## 2011-04-03 LAB — POCT INR: INR: 2.5

## 2011-04-03 NOTE — Telephone Encounter (Signed)
I reviewed with Dr Shirlee Latch. He recommended pt decrease her valsartan to 40mg  daily from 80mg  daily. Lisa,pt's daughter is aware of Dr Alford Highland recommendation.

## 2011-04-03 NOTE — Telephone Encounter (Signed)
Pt has been running low B/P's. Daughter states she has noticed more unsteadiness while up moving around. Also she states seems as if her energy level is down. Pt denies any dizziness but when ambulating down the hallway she was noted to be very wobble, moving from side to side. Her daughter's name and # : Jenna Luo [phone]

## 2011-04-03 NOTE — Telephone Encounter (Signed)
I offered pt's daughter an appt tomorrow with Tereso Newcomer and  she declined. Pt 's daughter was unable to talk any longer on the phone due to a commitment but said she would call me back to discuss this further.

## 2011-04-03 NOTE — Telephone Encounter (Signed)
This message was originally taken by [employer]. I talked with pt's daughter. She is concerned because at the last office visits with different doctors and her BPs have been low.

## 2011-04-05 ENCOUNTER — Telehealth: Payer: Self-pay | Admitting: *Deleted

## 2011-04-05 NOTE — Telephone Encounter (Signed)
Spoke with pt daughter she did get the message yesterday and the Pts. Diovan dose was decreased down to 40mg s on 04/04/11. Thus, appt s/c for follow up on 04/10/11 with the PA.

## 2011-04-05 NOTE — Telephone Encounter (Signed)
Message copied by MUSE, Franchot Mimes on Fri Apr 05, 2011  3:22 PM ------      Message from: Laurey Morale      Created: Wed Apr 03, 2011  5:08 PM      Regarding: RE: Low B/P       How low was her blood pressure? If low, would cut valsartan in half.  I need to see her back in the office.       ----- Message -----         From: Raul Del, RN         Sent: 04/03/2011   2:39 PM           To: Marca Ancona, MD, Jacqlyn Krauss, RN      Subject: Low B/P                                                  Pt has been running low B/P's. Daughter states she has noticed more unsteadiness while up moving around. Also she states seems as if her energy level is down. Pt denies any dizziness but when ambulating down the hallway she was noted to be very wobble, moving from side to side. Her daughter's name and # : Jenna Luo [phone]

## 2011-04-10 ENCOUNTER — Encounter: Payer: Self-pay | Admitting: Physician Assistant

## 2011-04-10 ENCOUNTER — Ambulatory Visit (INDEPENDENT_AMBULATORY_CARE_PROVIDER_SITE_OTHER): Payer: Medicare Other | Admitting: Physician Assistant

## 2011-04-10 VITALS — BP 83/58 | HR 53 | Ht 63.0 in | Wt 153.0 lb

## 2011-04-10 DIAGNOSIS — I4891 Unspecified atrial fibrillation: Secondary | ICD-10-CM

## 2011-04-10 DIAGNOSIS — I5023 Acute on chronic systolic (congestive) heart failure: Secondary | ICD-10-CM

## 2011-04-10 DIAGNOSIS — I10 Essential (primary) hypertension: Secondary | ICD-10-CM

## 2011-04-10 DIAGNOSIS — I5022 Chronic systolic (congestive) heart failure: Secondary | ICD-10-CM

## 2011-04-10 MED ORDER — METOPROLOL SUCCINATE ER 50 MG PO TB24: 50.0000 mg | ORAL_TABLET | Freq: Two times a day (BID) | ORAL | Status: DC

## 2011-04-10 NOTE — Patient Instructions (Addendum)
Your physician recommends that you return for lab work in: TODAY BMET, BNP, CBC 428.23  Your physician recommends that you schedule a follow-up appointment in: 05/15/11 @ 9:00 am to see DR. [facility]  Your physician has recommended you make the following change in your medication: STOP COREG AND START TOPROL XL 50 MG 1 TABLET TWICE DAILY  PLEASE GET A BLOOD PRESSURE CHECK AND EKG WITH YOUR PCP AND HAVE THEM FAX RESULTS TO [place], PA-C [phone]

## 2011-04-10 NOTE — Assessment & Plan Note (Signed)
BP is running low and may be contributing to balance issues.  Discussed with Dr. Marca Ancona.  We talked about changing coreg to toprol if she was orthostatic.  Patient not orthostatic on vitals but with how low her BP was with orthostatics, I elected to go ahead and change her coreg to toprol.  She will take Toprol XL 50 mg bid.  Will get a BP check and ECG with her PCP and have that sent to [place] next week. Check BNP, BMET, CBC today.  Follow up with Dr. Marca Ancona in 1 month.

## 2011-04-10 NOTE — Assessment & Plan Note (Signed)
Check BNP.  Adjust lasix if pre-renal.

## 2011-04-10 NOTE — Progress Notes (Signed)
History of Present Illness: PCP:  Dr. Duaine Dredge Primary Cardiologist:  Dr. Marca Ancona  Brianna Robertson is a 77 y.o. female with a history of dementia, atrial fibrillation, and possible tachycardia-mediated cardiomyopathy.  She had a complicated course late last year.  She went back into atrial fibrillation with rapid ventricular response in 12/11 and was hospitalized in [place].  Myoview showed no ischemia.  Echo showed EF 20% with global hypokinesis.  She was diuresed and rate was controlled.  She was started on coumadin.  No bleeding complcations since starting coumadin.  Repeat echo in 2/12 with rate control showed some improvement in EF, 35-40%.  There was moderate to severe mitral regurgitation and mild aortic stenosis.   Called in recently with low BP.  Daughter helps provide hx due to dementia.  She feels the patient is "wobbly."  She fears that she is falling and notes bruises on her lateral thighs.  Patient denies falling.  Patient is pleasant and denies chest pain, dyspnea, orthopnea, PND, syncope.  She has chronic ankle edema without change.  BPs have ranged 80/50 at times.  Diovan was cut in half when she notified RN in coumadin clinic her symptoms earlier this week.  Has not noted a difference.    Labs (1/12): K 4.5, creatinine 1.1, BNP 294 Labs (2/12): K 4.7, creatinine 1.3, BNP 332 Labs (3/12): K 4.6, creatinine 1.19, digoxin 0.5   Past Medical History: 1. Breast cancer, hx of  1995.  S/P lumpectomy and radiation (node, ER, PR negative). 2. Hypertension 3. Hypothyroidism  4. 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  Patient was cardioverted and maintained on amiodarone, and EF improved to 40%.  Patient stopped amiodarone because of anorexia and stopped coumadin due to rectus sheath hematoma.  Atrial fib with RVR recurred in 12/11.  EF down to 20% on echo.  Patient was rate-controlled and coumadin was restarted.   5.  Moderate dementia 6. CKD: mild 7. Cardiomyopathy: Possibly tachycardia-mediated.  In 4/07 in setting of atrial fibrillation with RVR, EF was 30%.  After conversion to NSR, EF 40% in 8/09.  In 12/11, noted to be back in atrial fibrillation with RVR.  Echo (12/11) with EF 20%, severe global hypokinesis, mild to moderate MR, PA systolic pressure 39 mmHg, mild to moderate AS with mean gradient of 13 (may be low gradient AS in the setting of decreased cardiac output).   Myoview in 4/07 was negative for ischemia or infarction.  Myoview (12/11) showed small fixed apical defect likely due to apical thinning.  No ischemia.   Echo (2/12): EF 35% with diffuse hypokinesis (worse anteroseptally), mild AS (mean gradient 16 mmHg), mild AI, moderate to severe MR, moderate biatrial enlargement, normal RV, PA systolic pressure 40 mmHg.  7. Hyperlipidemia 8. Pulmonary Nodule - stable 02/08 9. LBBB 10. Aortic stenosis: mild.  11. MItral regurgitation: moderate to severe.    Current Outpatient Prescriptions  Medication Sig Dispense Refill  . anastrozole (ARIMIDEX) 1 MG tablet Take 1 mg by mouth daily.        Marland Kitchen aspirin 81 MG tablet Take 81 mg by mouth daily.        . calcium carbonate (OS-CAL) 600 MG TABS Take 600 mg by mouth 2 (two) times daily with a meal.        . carvedilol (COREG) 12.5 MG tablet take 1 tablet by mouth twice a day  60 tablet  6  . citalopram (CELEXA) 20 MG tablet Take 20 mg  by mouth daily.        . digoxin (LANOXIN) 0.125 MG tablet Take one half tablet daily      . donepezil (ARICEPT) 10 MG tablet Take 10 mg by mouth at bedtime as needed.        . furosemide (LASIX) 40 MG tablet take 1 tablet by mouth once daily  30 tablet  6  . KLOR-CON M20 20 MEQ tablet take 1 tablet by mouth once daily  30 tablet  6  . levothyroxine (SYNTHROID, LEVOTHROID) 25 MCG tablet Take 25 mcg by mouth daily.        . memantine (NAMENDA TITRATION PACK) tablet pack Take 5 mg by mouth daily.        . simvastatin (ZOCOR)  20 MG tablet Take 20 mg by mouth at bedtime.        Marland Kitchen spironolactone (ALDACTONE) 25 MG tablet Take one half tablet by mouth daily       . tiotropium (SPIRIVA) 18 MCG inhalation capsule Place 18 mcg into inhaler and inhale daily.        . valsartan (DIOVAN) 80 MG tablet Take 1/2 tablet daily      . warfarin (COUMADIN) 2.5 MG tablet Take 2.5 mg by mouth as directed. Take 2 tablets by mouth as directed         Allergies: Allergies  Allergen Reactions  . Penicillins   . Sulfonamide Derivatives     History  Substance Use Topics  . Smoking status: Never Smoker   . Smokeless tobacco: Never Used  . Alcohol Use: No     very infrequently     ROS:  Please see the history of present illness.   Hematological and Lymphatic ROS: negative for - bleeding problems Gastrointestinal ROS: negative for - blood in stools, hematemesis or melena  All other systems reviewed and negative.   Vital Signs: BP 105/76  Pulse 91  Ht 5\' 3"  (1.6 m)  Wt 153 lb (69.4 kg)  BMI 27.10 kg/m2  Filed Vitals:   04/10/11 1620 04/10/11 1621 04/10/11 1622 04/10/11 1624  BP: 87/59 82/58 87/61  83/58  Pulse: 60 74 82 53  Height:      Weight:         PHYSICAL EXAM: Well nourished, well developed, in no acute distress HEENT: normal Neck: no JVD Cardiac:  normal S1, S2; RRR; no murmur Lungs:  clear to auscultation bilaterally, no wheezing, rhonchi or rales Abd: soft, nontender, no hepatomegaly Ext: no edema Skin: warm and dry Neuro:  CNs 2-12 intact, no focal abnormalities noted  EKG:   Afib, HR 91, LBBB  ASSESSMENT AND PLAN:

## 2011-04-10 NOTE — Assessment & Plan Note (Signed)
Rate controlled.  Continue coumadin.  If falling becomes more evident may need to reconsider whether she should stay on coumadin going forward.  Change beta blocker as noted and follow up ECG in one week to make sure HR controlled.

## 2011-04-11 LAB — CBC WITH DIFFERENTIAL/PLATELET
Basophils Absolute: 0 10*3/uL (ref 0.0–0.1)
Basophils Relative: 0.3 % (ref 0.0–3.0)
Eosinophils Absolute: 0.2 10*3/uL (ref 0.0–0.7)
Eosinophils Relative: 4.1 % (ref 0.0–5.0)
HCT: 37.5 % (ref 36.0–46.0)
Hemoglobin: 12.5 g/dL (ref 12.0–15.0)
Lymphocytes Relative: 25 % (ref 12.0–46.0)
Lymphs Abs: 1.1 10*3/uL (ref 0.7–4.0)
MCHC: 33.5 g/dL (ref 30.0–36.0)
MCV: 101 fl — ABNORMAL HIGH (ref 78.0–100.0)
Monocytes Absolute: 0.4 10*3/uL (ref 0.1–1.0)
Monocytes Relative: 8.8 % (ref 3.0–12.0)
Neutro Abs: 2.6 10*3/uL (ref 1.4–7.7)
Neutrophils Relative %: 61.8 % (ref 43.0–77.0)
Platelets: 197 10*3/uL (ref 150.0–400.0)
RBC: 3.71 Mil/uL — ABNORMAL LOW (ref 3.87–5.11)
RDW: 15.4 % — ABNORMAL HIGH (ref 11.5–14.6)
WBC: 4.3 10*3/uL — ABNORMAL LOW (ref 4.5–10.5)

## 2011-04-11 LAB — BASIC METABOLIC PANEL
BUN: 20 mg/dL (ref 6–23)
CO2: 26 mEq/L (ref 19–32)
Calcium: 9.6 mg/dL (ref 8.4–10.5)
Chloride: 104 mEq/L (ref 96–112)
Creatinine, Ser: 1.3 mg/dL — ABNORMAL HIGH (ref 0.4–1.2)
GFR: 43.43 mL/min — ABNORMAL LOW (ref >60.00)
Glucose, Bld: 86 mg/dL (ref 70–99)
Potassium: 5.7 mEq/L — ABNORMAL HIGH (ref 3.5–5.1)
Sodium: 141 mEq/L (ref 135–145)

## 2011-04-12 LAB — BRAIN NATRIURETIC PEPTIDE: Pro B Natriuretic peptide (BNP): 279 pg/mL — ABNORMAL HIGH (ref 0.0–100.0)

## 2011-04-15 ENCOUNTER — Telehealth: Payer: Self-pay | Admitting: *Deleted

## 2011-04-15 DIAGNOSIS — I5022 Chronic systolic (congestive) heart failure: Secondary | ICD-10-CM

## 2011-04-15 DIAGNOSIS — I4891 Unspecified atrial fibrillation: Secondary | ICD-10-CM

## 2011-04-15 NOTE — Telephone Encounter (Signed)
pt aware of labs and repeat STAT bmet 04/16/11. Danielle Rankin

## 2011-04-16 ENCOUNTER — Telehealth: Payer: Self-pay | Admitting: Cardiology

## 2011-04-16 ENCOUNTER — Other Ambulatory Visit: Payer: Medicare Other | Admitting: *Deleted

## 2011-04-16 DIAGNOSIS — E875 Hyperkalemia: Secondary | ICD-10-CM

## 2011-04-16 NOTE — Telephone Encounter (Signed)
FU Call: Pt daughter returning call to [facility] regarding pt lab results. Please return call to discuss further.

## 2011-04-17 ENCOUNTER — Other Ambulatory Visit (INDEPENDENT_AMBULATORY_CARE_PROVIDER_SITE_OTHER): Payer: Medicare Other

## 2011-04-17 ENCOUNTER — Telehealth: Payer: Self-pay

## 2011-04-17 ENCOUNTER — Telehealth: Payer: Self-pay | Admitting: *Deleted

## 2011-04-17 DIAGNOSIS — E875 Hyperkalemia: Secondary | ICD-10-CM

## 2011-04-17 LAB — BASIC METABOLIC PANEL
BUN: 18 mg/dL (ref 6–23)
Chloride: 99 mEq/L (ref 96–112)
Glucose, Bld: 96 mg/dL (ref 70–99)
Potassium: 4.5 mEq/L (ref 3.5–5.1)

## 2011-04-17 NOTE — Telephone Encounter (Signed)
Patient's daughter Efraim Kaufmann called and was told of mother's normal potassium 4.5.Advised to continue to hold potassium and aldactone.Labs fowarded to Dr. Shirlee Latch

## 2011-04-17 NOTE — Telephone Encounter (Signed)
Message copied by Charna Elizabeth on Wed Apr 17, 2011  2:18 PM ------      Message from: Jacqlyn Krauss      Created: Tue Apr 16, 2011  1:05 PM       Please call pt's daughter Misty Stanley at [phone] with BMET results to be done 04/17/11. Thanks. Thurston Hole

## 2011-04-17 NOTE — Telephone Encounter (Signed)
Information forwarded to Dr Warren Danes

## 2011-04-17 NOTE — Telephone Encounter (Signed)
Dr Warren Danes called and wanted to know if pt can hold her coumadin for 1 day for a dental extraction. Please fax clearance note to # [id number].

## 2011-04-17 NOTE — Telephone Encounter (Signed)
She can hold coumadin for dental extraction, restart afterwards.

## 2011-04-24 ENCOUNTER — Telehealth: Payer: Self-pay | Admitting: *Deleted

## 2011-04-24 NOTE — Telephone Encounter (Signed)
Patient's daughter is aware for pt  to hold coumadin one day for dental extraction and that Dr. Warren Danes has that information. Patient's daughter verbalized understanding.

## 2011-04-24 NOTE — Telephone Encounter (Signed)
New message:  Pt having tooth extracted on Tuesday, does she need to d/c coumadin.  Please call and let her know.

## 2011-04-26 ENCOUNTER — Ambulatory Visit: Payer: Medicare Other | Admitting: Cardiology

## 2011-05-01 ENCOUNTER — Ambulatory Visit (INDEPENDENT_AMBULATORY_CARE_PROVIDER_SITE_OTHER): Payer: Medicare Other | Admitting: *Deleted

## 2011-05-01 DIAGNOSIS — I4891 Unspecified atrial fibrillation: Secondary | ICD-10-CM

## 2011-05-01 DIAGNOSIS — Z7901 Long term (current) use of anticoagulants: Secondary | ICD-10-CM | POA: Insufficient documentation

## 2011-05-01 LAB — POCT INR: INR: 2.8

## 2011-05-15 ENCOUNTER — Ambulatory Visit (INDEPENDENT_AMBULATORY_CARE_PROVIDER_SITE_OTHER): Payer: Medicare Other | Admitting: Cardiology

## 2011-05-15 ENCOUNTER — Encounter: Payer: Self-pay | Admitting: Cardiology

## 2011-05-15 ENCOUNTER — Other Ambulatory Visit: Payer: Self-pay | Admitting: Cardiovascular Disease

## 2011-05-15 DIAGNOSIS — J069 Acute upper respiratory infection, unspecified: Secondary | ICD-10-CM

## 2011-05-15 DIAGNOSIS — I5022 Chronic systolic (congestive) heart failure: Secondary | ICD-10-CM

## 2011-05-15 DIAGNOSIS — R0602 Shortness of breath: Secondary | ICD-10-CM

## 2011-05-15 DIAGNOSIS — I4891 Unspecified atrial fibrillation: Secondary | ICD-10-CM

## 2011-05-15 LAB — BASIC METABOLIC PANEL
CO2: 34 mEq/L — ABNORMAL HIGH (ref 19–32)
Chloride: 101 mEq/L (ref 96–112)
Creatinine, Ser: 1 mg/dL (ref 0.4–1.2)

## 2011-05-15 NOTE — Progress Notes (Signed)
PCP: Dr. Duaine Dredge  77 yo with history of dementia, atrial fibrillation, and possible tachycardia-mediated cardiomyopathy returns for office followup.  She has had a complicated course earlier this year.  She went back into atrial fibrillation with rapid ventricular response in 12/11 and was hospitalized in [place].  Myoview showed no ischemia.  Echo showed EF 20% with global hypokinesis.  She was diuresed and rate was controlled.  She was started on coumadin.  No bleeding complcations since starting coumadin.  Repeat echo in 2/12 with rate control showed some improvement in EF, 35-40%.  There was moderate to severe mitral regurgitation and mild aortic stenosis.  She saw the PA at last visit and cardiac meds were adjusted due to blood pressure running low and some lightheadedness.  She was taken off Coreg and put on Toprol XL.  Valsartan was decreased.   Patient has significant trouble with memory and is accompanied by her daughter who gives much of the history.  HR is under control, in the 70s today.  No chest pain. She denies exertional dyspnea, and her daughter says she has been walking without much problem.  No lightheadedness, no falls.  She is steady on her feet per her daughter.  Weight is up 1 lb since last appointment.  Her daughter has said that she has had some cough and congestion over the last few days.  No fever or chills.  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 Pcn 2)  ! Sulfa  Past Medical History: 1. Breast cancer, hx of  1995.  S/P lumpectomy and radiation (node, ER, PR negative). 2. Hypertension 3. Hypothyroidism  4. 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  Patient was cardioverted and maintained on amiodarone,  and EF improved to 40%.  Patient stopped amiodarone because of anorexia and stopped coumadin due to rectus sheath hematoma.  Atrial fib with RVR recurred in 12/11.  EF down to 20% on echo.  Patient was rate-controlled and coumadin was restarted.   5. Moderate dementia 6. CKD: mild 7. Cardiomyopathy: Possibly tachycardia-mediated.  In 4/07 in setting of atrial fibrillation with RVR, EF was 30%.  After conversion to NSR, EF 40% in 8/09.  In 12/11, noted to be back in atrial fibrillation with RVR.  Echo (12/11) with EF 20%, severe global hypokinesis, mild to moderate MR, PA systolic pressure 39 mmHg, mild to moderate AS with mean gradient of 13 (may be low gradient AS in the setting of decreased cardiac output).   Myoview in 4/07 was negative for ischemia or infarction.  Myoview (12/11) showed small fixed apical defect likely due to apical thinning.  No ischemia.   Echo (2/12): EF 35% with diffuse hypokinesis (worse anteroseptally), mild AS (mean gradient 16 mmHg), mild AI, moderate to severe MR, moderate biatrial enlargement, normal RV, PA systolic pressure 40 mmHg.  7. Hyperlipidemia 8. Pulmonary Nodule - stable 02/08 9. LBBB 10. Aortic stenosis: mild.  11. MItral regurgitation: moderate to severe.  12. Asthma  Family History: Family hx of heart disease,cancer, and arthritis. Brother- pancreatic cancer  Social History: Never Smoked Lives by herself but someone checks in twice a day.  Widow, 2 daughters (1 in [place], 1 in [place]) Accompanied by supportive daughter  Retired office assistance Was active in  local politics  Review of Systems        All systems reviewed and negative except as per HPI.   Current Outpatient Prescriptions  Medication Sig Dispense Refill  . anastrozole (ARIMIDEX) 1 MG tablet Take 1 mg by mouth daily.        . calcium carbonate (OS-CAL) 600 MG TABS Take 600 mg by mouth 2 (two) times daily with a meal.        . citalopram (CELEXA) 20 MG tablet Take 20 mg by  mouth daily.        . digoxin (LANOXIN) 0.125 MG tablet Take one half tablet daily      . donepezil (ARICEPT) 10 MG tablet Take 10 mg by mouth at bedtime as needed.        . furosemide (LASIX) 40 MG tablet take 1 tablet by mouth once daily  30 tablet  6  . levothyroxine (SYNTHROID, LEVOTHROID) 25 MCG tablet Take 25 mcg by mouth daily.        . memantine (NAMENDA TITRATION PACK) tablet pack Take 5 mg by mouth daily.        . metoprolol (TOPROL XL) 50 MG 24 hr tablet Take 1 tablet (50 mg total) by mouth 2 (two) times daily.  60 tablet  11  . simvastatin (ZOCOR) 20 MG tablet Take 20 mg by mouth at bedtime.        Marland Kitchen tiotropium (SPIRIVA) 18 MCG inhalation capsule Place 18 mcg into inhaler and inhale daily.        . valsartan (DIOVAN) 80 MG tablet Take 1/2 tablet daily      . warfarin (COUMADIN) 2.5 MG tablet Take 2.5 mg by mouth as directed. Take 2 tablets by mouth as directed         BP 104/58  Pulse 71  Ht 5\' 4"  (1.626 m)  Wt 69.908 kg (154 lb 1.9 oz)  BMI 26.45 kg/m2  SpO2 94% General:  Elderly woman in no distress.  Neck:  Neck supple, no JVD. No masses, thyromegaly or abnormal cervical nodes. Lungs:  Clear except for occasional wheeze.  Heart:  Lateral PMI, chest non-tender; irregular rate and rhythm, S1, S2 without rubs or gallops. 2/6 systolic murmur at apex and upper sternal border, S2 is heard clearly.  Carotid upstroke normal, no bruit. Pedals normal pulses.  1+ edema to knee on left, 1+ edema 1/3 to knee on right.  Abdomen:  Bowel sounds positive; abdomen soft and non-tender without masses, organomegaly, or hernias noted. No hepatosplenomegaly. Extremities:  No clubbing or cyanosis. Neurologic:  Alert and oriented x 3. Psych:  Normal affect.

## 2011-05-15 NOTE — Assessment & Plan Note (Signed)
Patient has URI symptoms with cough and congestion.  Lungs are clear except for a rare wheeze.  No fever.  For now treat symptomatically.  She can call back here or to PCP if symptoms get worse.

## 2011-05-15 NOTE — Assessment & Plan Note (Addendum)
Chronic systolic heart failure. EF 35% with moderate to severe MR on most recent echo.  NYHA class II symptoms, stable.  She does not appear significantly volume overloaded on exam.  Cardiomyopathy may be tachycardia-mediated.  HR is now in the 60 - 70s.  Myoview in 12/11 showed no ischemia or infarction.  She is not having lightheaded spells and SBP has been running in the 100s-120s since we changed her medications at last appointment.  - Continue current Lasix dose.    - Continue current doses of digoxin, Toprol XL, and valsartan.  - BMET, BNP, and digoxin level today.

## 2011-05-15 NOTE — Assessment & Plan Note (Addendum)
Rate controlled.  Continue coumadin.  If falling becomes more evident may need to reconsider whether she should stay on coumadin going forward.  Continue Toprol XL; she seems to be tolerating this well.  As she is on coumadin, she can stop ASA.

## 2011-05-15 NOTE — Patient Instructions (Signed)
Stop aspirin.  Your physician recommends that you have  lab work today--BMET/BNP / Digoxin level 427.31  428.22  Your physician recommends that you schedule a follow-up appointment in: 3 months with Dr Shirlee Latch.

## 2011-05-16 ENCOUNTER — Other Ambulatory Visit: Payer: Self-pay | Admitting: Cardiology

## 2011-05-16 LAB — DIGOXIN LEVEL: Digoxin Level: 0.9 ng/mL (ref 0.8–2.0)

## 2011-05-29 ENCOUNTER — Encounter: Payer: Medicare Other | Admitting: *Deleted

## 2011-05-30 ENCOUNTER — Ambulatory Visit (INDEPENDENT_AMBULATORY_CARE_PROVIDER_SITE_OTHER): Payer: Medicare Other | Admitting: *Deleted

## 2011-05-30 DIAGNOSIS — I4891 Unspecified atrial fibrillation: Secondary | ICD-10-CM

## 2011-05-30 DIAGNOSIS — Z7901 Long term (current) use of anticoagulants: Secondary | ICD-10-CM

## 2011-06-02 ENCOUNTER — Telehealth: Payer: Self-pay | Admitting: Physician Assistant

## 2011-06-02 NOTE — Telephone Encounter (Signed)
Patient called earlier today regarding headache. I returned the call, and left a message on her answering machine.

## 2011-06-04 ENCOUNTER — Ambulatory Visit: Payer: Medicare Other | Admitting: Cardiology

## 2011-06-05 ENCOUNTER — Ambulatory Visit (INDEPENDENT_AMBULATORY_CARE_PROVIDER_SITE_OTHER): Payer: Medicare Other | Admitting: Physician Assistant

## 2011-06-05 ENCOUNTER — Encounter: Payer: Self-pay | Admitting: Physician Assistant

## 2011-06-05 DIAGNOSIS — R001 Bradycardia, unspecified: Secondary | ICD-10-CM | POA: Insufficient documentation

## 2011-06-05 DIAGNOSIS — I5023 Acute on chronic systolic (congestive) heart failure: Secondary | ICD-10-CM

## 2011-06-05 DIAGNOSIS — F0280 Dementia in other diseases classified elsewhere without behavioral disturbance: Secondary | ICD-10-CM

## 2011-06-05 DIAGNOSIS — I498 Other specified cardiac arrhythmias: Secondary | ICD-10-CM

## 2011-06-05 DIAGNOSIS — G309 Alzheimer's disease, unspecified: Secondary | ICD-10-CM

## 2011-06-05 DIAGNOSIS — I4891 Unspecified atrial fibrillation: Secondary | ICD-10-CM

## 2011-06-05 MED ORDER — METOPROLOL SUCCINATE ER 25 MG PO TB24: 25.0000 mg | ORAL_TABLET | Freq: Two times a day (BID) | ORAL | Status: DC

## 2011-06-05 NOTE — Assessment & Plan Note (Signed)
She is likely displaying signs of tachy-brady and may ultimately need a pacer.  I have d/w this with the family.

## 2011-06-05 NOTE — Assessment & Plan Note (Addendum)
As noted, her rhythm is very slow.  Effective heart rate is probably in the 20s.  She is also volume overloaded.  Her daughter is here with her today.  But, her mother does live by herself.  I discussed her case with Dr. Lewayne Bunting.  She ultimately needs a BiV pacer.  But, she may not be a great candidate with her ongoing dementia.  We discussed holding her digoxin and gentle diuresis with close follow up vs admission to the hospital.  I had a long discussion with the patient's daughter and her other daughter (who is a PT in [place]) over the phone.  Her daughter who lives in [place] will stay with her tonight.  They are comfortable with medication changes and close follow up.   1. Stop digoxin 2. Check a bmet today 3. Hold toprol tonight 4. Reduce Toprol to 25 mg bid tomorrow 5. See me on Friday (Dr. Marca Ancona is here as DOD). 6. Go to ED if symptoms worsen

## 2011-06-05 NOTE — Patient Instructions (Signed)
Your physician recommends that you schedule a follow-up appointment in: 1/11 with Tereso Newcomer, PA-C Your physician recommends that you return for lab work in: today [facility]) Your physician has recommended you make the following change in your medication: HOLD Metoprolol tonight and restart tomorrow at 25 mg twice daily TAKE 1 extra 40 mg of Furosemide in the morning STOP Digoxin

## 2011-06-05 NOTE — Progress Notes (Signed)
[address] [place], [place]  [address] Phone: [phone] Fax:  [phone]  Date:  06/05/2011   Name:  Brianna Robertson       DOB:  [birth year] MRN:  [id number]  PCP:  Dr. Duaine Dredge Primary Cardiologist:  Dr. Marca Ancona  Primary Electrophysiologist:  None    History of Present Illness: Brianna Robertson is a 78 y.o. female who presents for a complaint of dyspnea.  She has a history of dementia, atrial fibrillation, and possible tachycardia-mediated cardiomyopathy.  She has had a complicated course earlier this year. She went back into atrial fibrillation with rapid ventricular response in 12/11 and was hospitalized in [place]. Myoview showed no ischemia. Echo showed EF 20% with global hypokinesis. She was diuresed and rate was controlled. She was started on coumadin. No bleeding complcations since starting coumadin. Repeat echo in 2/12 with rate control showed some improvement in EF, 35-40%. There was moderate to severe mitral regurgitation and mild aortic stenosis.  I saw her in 11/12 and she was taken off Coreg and put on Toprol XL. Valsartan was decreased.   Over the last several weeks, she has been noticeably more SOB with exertion.  She notes orthopnea and increased edema.  Weight is up 3 lbs by our scales.  She denies PND.  No chest pain.  She is dizzy and lightheaded.  Often appears disoriented.  No syncope or near syncope.  She has garbled speech at times and weakness.  Her family thought she was having TIAs.  No facial droop or aphasia.  She is wearing her O2 at home which is unusual.    Past Medical History:  1. Breast cancer, hx of 1995. S/P lumpectomy and radiation (node, ER, PR negative).  2. Hypertension  3. Hypothyroidism  4. 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 Patient was cardioverted and maintained on amiodarone, and EF improved to 40%. Patient stopped amiodarone because of anorexia  and stopped coumadin due to rectus sheath hematoma. Atrial fib with RVR recurred in 12/11. EF down to 20% on echo. Patient was rate-controlled and coumadin was restarted.  5. Moderate dementia  6. CKD: mild  7. Cardiomyopathy: Possibly tachycardia-mediated. In 4/07 in setting of atrial fibrillation with RVR, EF was 30%. After conversion to NSR, EF 40% in 8/09. In 12/11, noted to be back in atrial fibrillation with RVR. Echo (12/11) with EF 20%, severe global hypokinesis, mild to moderate MR, PA systolic pressure 39 mmHg, mild to moderate AS with mean gradient of 13 (may be low gradient AS in the setting of decreased cardiac output). Myoview in 4/07 was negative for ischemia or infarction. Myoview (12/11) showed small fixed apical defect likely due to apical thinning. No ischemia. Echo (2/12): EF 35% with diffuse hypokinesis (worse anteroseptally), mild AS (mean gradient 16 mmHg), mild AI, moderate to severe MR, moderate biatrial enlargement, normal RV, PA systolic pressure 40 mmHg.  7. Hyperlipidemia  8. Pulmonary Nodule - stable 02/08  9. LBBB  10. Aortic stenosis: mild.  11. MItral regurgitation: moderate to severe.  12. Asthma   Current Outpatient Prescriptions  Medication Sig Dispense Refill  . anastrozole (ARIMIDEX) 1 MG tablet Take 1 mg by mouth daily.        . calcium carbonate (OS-CAL) 600 MG TABS Take 600 mg by mouth 2 (two) times daily with a meal.        . citalopram (CELEXA) 20 MG tablet Take 20 mg by mouth daily.        Marland Kitchen  digoxin (LANOXIN) 0.125 MG tablet take 1 tablet by mouth once daily  30 tablet  6  . donepezil (ARICEPT) 10 MG tablet Take 10 mg by mouth at bedtime as needed.        . furosemide (LASIX) 40 MG tablet take 1 tablet by mouth once daily  30 tablet  6  . levothyroxine (SYNTHROID, LEVOTHROID) 25 MCG tablet Take 25 mcg by mouth daily.        . memantine (NAMENDA TITRATION PACK) tablet pack Take 5 mg by mouth daily.        . metoprolol (TOPROL XL) 50 MG 24 hr tablet  Take 1 tablet (50 mg total) by mouth 2 (two) times daily.  60 tablet  11  . simvastatin (ZOCOR) 20 MG tablet Take 20 mg by mouth at bedtime.        Marland Kitchen tiotropium (SPIRIVA) 18 MCG inhalation capsule Place 18 mcg into inhaler and inhale daily.        . valsartan (DIOVAN) 80 MG tablet Take 1/2 tablet daily      . warfarin (COUMADIN) 2.5 MG tablet Take 2.5 mg by mouth as directed. Take 2 tablets by mouth as directed       . warfarin (COUMADIN) 2.5 MG tablet  TODAY TAKE 3 TABLETS, THEN BEGIN  3 TABLETS EVERY DAY EXCEPT  2 TABLETS ON MONDAYS, WEDNESDAYS AND FRIDAYS. RECHECK IN 2 WEEKS.  70 tablet  2    Allergies: Allergies  Allergen Reactions  . Penicillins   . Sulfonamide Derivatives     History  Substance Use Topics  . Smoking status: Never Smoker   . Smokeless tobacco: Never Used  . Alcohol Use: No     very infrequently     Family History  Problem Relation Age of Onset  . Liver cancer Brother   . Heart attack Father 18  . Cancer    . Arthritis    . Heart disease Mother     CHF  . Dementia Mother   . Heart attack Brother   . Other Brother     staph infection  . Dementia Sister      ROS:  Please see the history of present illness.   No fevers, chills, cough, melena, hematochezia, vomiting, diarrhea.  All other systems reviewed and negative.   PHYSICAL EXAM: VS:  BP 114/52  Pulse 68  Ht 5\' 7"  (1.702 m)  Wt 157 lb 12.8 oz (71.578 kg)  BMI 24.72 kg/m2 O2 87-91% with ambulation in the office Well nourished, well developed, in no acute distress HEENT: normal Neck: + JVD at 90 degrees Cardiac:  Slow irregular rhythm; no murmur Lungs:  Bibasilar crackles, no wheezes Abd: soft, nontender  Ext: 1-2+ bilateral edema Skin: warm and dry Neuro:  CNs 2-12 intact, no focal abnormalities noted Psych: pleasant   EKG:  Reviewed with Dr. Lewayne Bunting:  Appears to be sinus brady with junctional escape beats and multifocal PVCs  ASSESSMENT AND PLAN:

## 2011-06-05 NOTE — Assessment & Plan Note (Signed)
Aricept may also be contributing to her bradycardia.  However, I am not certain that stopping this medication would make more of a difference in correcting her heart rate than stopping the digoxin.

## 2011-06-05 NOTE — Assessment & Plan Note (Signed)
She is volume overloaded.  But with her slow rhythm, we do not want to be overly aggressive with diuresis.   1. Take Lasix 40 mg extra x 1 tomorrow. 2. Check bmet today 3. Follow up in 2 days.

## 2011-06-06 LAB — BASIC METABOLIC PANEL
BUN: 26 mg/dL — ABNORMAL HIGH (ref 6–23)
Chloride: 95 mEq/L — ABNORMAL LOW (ref 96–112)
Creatinine, Ser: 1.5 mg/dL — ABNORMAL HIGH (ref 0.4–1.2)
GFR: 36.58 mL/min — ABNORMAL LOW (ref >60.00)
Glucose, Bld: 108 mg/dL — ABNORMAL HIGH (ref 70–99)
Potassium: 3.7 mEq/L (ref 3.5–5.1)

## 2011-06-07 ENCOUNTER — Other Ambulatory Visit: Payer: Medicare Other | Admitting: *Deleted

## 2011-06-07 ENCOUNTER — Telehealth: Payer: Self-pay | Admitting: *Deleted

## 2011-06-07 ENCOUNTER — Encounter: Payer: Self-pay | Admitting: Physician Assistant

## 2011-06-07 ENCOUNTER — Ambulatory Visit (INDEPENDENT_AMBULATORY_CARE_PROVIDER_SITE_OTHER): Payer: Medicare Other | Admitting: Physician Assistant

## 2011-06-07 DIAGNOSIS — I498 Other specified cardiac arrhythmias: Secondary | ICD-10-CM

## 2011-06-07 DIAGNOSIS — I5023 Acute on chronic systolic (congestive) heart failure: Secondary | ICD-10-CM

## 2011-06-07 DIAGNOSIS — I10 Essential (primary) hypertension: Secondary | ICD-10-CM

## 2011-06-07 DIAGNOSIS — I4891 Unspecified atrial fibrillation: Secondary | ICD-10-CM

## 2011-06-07 LAB — BASIC METABOLIC PANEL
BUN: 28 mg/dL — ABNORMAL HIGH (ref 6–23)
Calcium: 9.9 mg/dL (ref 8.4–10.5)
Creatinine, Ser: 1.6 mg/dL — ABNORMAL HIGH (ref 0.4–1.2)
GFR: 33.62 mL/min — ABNORMAL LOW (ref >60.00)
Glucose, Bld: 106 mg/dL — ABNORMAL HIGH (ref 70–99)

## 2011-06-07 MED ORDER — FUROSEMIDE 40 MG PO TABS: 40.0000 mg | ORAL_TABLET | Freq: Two times a day (BID) | ORAL | Status: DC

## 2011-06-07 MED ORDER — METOPROLOL SUCCINATE ER 25 MG PO TB24: 25.0000 mg | ORAL_TABLET | Freq: Every day | ORAL | Status: DC

## 2011-06-07 MED ORDER — POTASSIUM CHLORIDE CRYS ER 20 MEQ PO TBCR: 20.0000 meq | EXTENDED_RELEASE_TABLET | Freq: Two times a day (BID) | ORAL | Status: DC

## 2011-06-07 NOTE — Assessment & Plan Note (Signed)
She is still volume overloaded.  Her creatinine was increased recently.  Repeated basic metabolic panel today.  Increase Lasix to 40 mg twice a day.  Increase potassium to 20 mEq twice a day.  She will be brought back in early followup next week with Dr. Marca Ancona or me and a repeat basic metabolic panel.

## 2011-06-07 NOTE — Telephone Encounter (Signed)
LMTCB

## 2011-06-07 NOTE — Telephone Encounter (Signed)
06/07/11--PER S WEAVER--PT WILL BE COMING IN TODAY FOR APPOINT AND HE WILL GET LABS THEN--NT

## 2011-06-07 NOTE — Telephone Encounter (Signed)
New Problem:    Patient's daughter called in wanting to get oxygen for her mother 24/7 and how many liters should her oxygen be running at (2.0 or higher).

## 2011-06-07 NOTE — Assessment & Plan Note (Signed)
Heart rate is much improved.  I reviewed her case with Dr. Marca Ancona who also saw her.  We will decrease her Toprol further to 25 mg once a day.  Her slow heart rate likely drove her to be volume overloaded.  Adjust lasix as noted.  Bring back for early follow up next week.

## 2011-06-07 NOTE — Patient Instructions (Signed)
Your physician recommends that you schedule a follow-up appointment in: 1 week with Dr Shirlee Latch or Tereso Newcomer, PA-C Your physician recommends that you return for lab work in: [facility] today and next Wed Your physician has recommended you make the following change in your medication: STOP Digoxin -- DECREASE Metoprolol to once daily -- INCREASE Furosemide and Potassium to twice daily

## 2011-06-07 NOTE — Telephone Encounter (Signed)
Fu to previous call °Patient is returning your call °

## 2011-06-07 NOTE — Assessment & Plan Note (Signed)
Controlled.  

## 2011-06-07 NOTE — Assessment & Plan Note (Signed)
Now in NSR.  HR faster now but still slow.  Decrease Toprol as noted.  Follow up again next week.

## 2011-06-07 NOTE — Progress Notes (Signed)
[address] [place], [place]  [address] Phone: [phone] Fax:  [phone]  Date:  06/07/2011   Name:  Brianna Robertson       DOB:  1926-01-14 MRN:  [id number]  PCP:  Dr. Duaine Dredge Primary Cardiologist:  Dr. Marca Ancona  Primary Electrophysiologist:  None    History of Present Illness: Brianna Robertson is a 78 y.o. female who presents for early follow up.  She has a history of dementia, atrial fibrillation, and possible tachycardia-mediated cardiomyopathy.  She went back into atrial fibrillation with rapid ventricular response in 12/11 and was hospitalized in [place]. Myoview showed no ischemia. Echo showed EF 20% with global hypokinesis. She was diuresed and rate was controlled. She was started on coumadin. No bleeding complcations since starting coumadin. Repeat echo in 2/12 with rate control showed some improvement in EF, 35-40%. There was moderate to severe mitral regurgitation and mild aortic stenosis.     I saw her earlier this week with increased dyspnea, dizziness and lightheadedness.  Her ECG demonstrated significant bradycardia with junctional escape beats and multifocal PVCs.  We thought that this was likely related to digoxin and this was stopped.  I also reduced her Toprol.  She was also somewhat volume overloaded.  She was advised to take one extra dose of Lasix yesterday.  She was brought back today for early followup.    She is here with her daughter.  She feels about the same.  Still having significant DOE.  Slept in recliner last night.  No chest pain.  No syncope.  Still c/o dizziness.    Past Medical History:  1. Breast cancer, hx of 1995. S/P lumpectomy and radiation (node, ER, PR negative).  2. Hypertension  3. Hypothyroidism  4. 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 Patient was cardioverted and maintained on amiodarone, and EF improved to 40%. Patient stopped amiodarone  because of anorexia and stopped coumadin due to rectus sheath hematoma. Atrial fib with RVR recurred in 12/11. EF down to 20% on echo. Patient was rate-controlled and coumadin was restarted.  5. Moderate dementia  6. CKD: mild  7. Cardiomyopathy: Possibly tachycardia-mediated. In 4/07 in setting of atrial fibrillation with RVR, EF was 30%. After conversion to NSR, EF 40% in 8/09. In 12/11, noted to be back in atrial fibrillation with RVR. Echo (12/11) with EF 20%, severe global hypokinesis, mild to moderate MR, PA systolic pressure 39 mmHg, mild to moderate AS with mean gradient of 13 (may be low gradient AS in the setting of decreased cardiac output). Myoview in 4/07 was negative for ischemia or infarction. Myoview (12/11) showed small fixed apical defect likely due to apical thinning. No ischemia. Echo (2/12): EF 35% with diffuse hypokinesis (worse anteroseptally), mild AS (mean gradient 16 mmHg), mild AI, moderate to severe MR, moderate biatrial enlargement, normal RV, PA systolic pressure 40 mmHg.  7. Hyperlipidemia  8. Pulmonary Nodule - stable 02/08  9. LBBB  10. Aortic stenosis: mild.  11. MItral regurgitation: moderate to severe.  12. Asthma   Current Outpatient Prescriptions  Medication Sig Dispense Refill  . anastrozole (ARIMIDEX) 1 MG tablet Take 1 mg by mouth daily.        . calcium carbonate (OS-CAL) 600 MG TABS Take 600 mg by mouth 2 (two) times daily with a meal.        . citalopram (CELEXA) 20 MG tablet Take 20 mg by mouth daily.        Marland Kitchen  donepezil (ARICEPT) 10 MG tablet Take 10 mg by mouth at bedtime as needed.        . furosemide (LASIX) 40 MG tablet take 1 tablet by mouth once daily  30 tablet  6  . levothyroxine (SYNTHROID, LEVOTHROID) 25 MCG tablet Take 25 mcg by mouth daily.        . memantine (NAMENDA TITRATION PACK) tablet pack Take 5 mg by mouth daily.        . metoprolol succinate (TOPROL XL) 25 MG 24 hr tablet Take 1 tablet (25 mg total) by mouth 2 (two) times daily.   60 tablet  11  . simvastatin (ZOCOR) 20 MG tablet Take 20 mg by mouth at bedtime.        Marland Kitchen tiotropium (SPIRIVA) 18 MCG inhalation capsule Place 18 mcg into inhaler and inhale daily.        . valsartan (DIOVAN) 80 MG tablet Take 1/2 tablet daily      . warfarin (COUMADIN) 2.5 MG tablet Take 2.5 mg by mouth as directed. Take 2 tablets by mouth as directed       . warfarin (COUMADIN) 2.5 MG tablet  TODAY TAKE 3 TABLETS, THEN BEGIN  3 TABLETS EVERY DAY EXCEPT  2 TABLETS ON MONDAYS, WEDNESDAYS AND FRIDAYS. RECHECK IN 2 WEEKS.  70 tablet  2    Allergies: Allergies  Allergen Reactions  . Penicillins   . Sulfonamide Derivatives     History  Substance Use Topics  . Smoking status: Never Smoker   . Smokeless tobacco: Never Used  . Alcohol Use: No     very infrequently     Family History  Problem Relation Age of Onset  . Liver cancer Brother   . Heart attack Father 57  . Cancer    . Arthritis    . Heart disease Mother     CHF  . Dementia Mother   . Heart attack Brother   . Other Brother     staph infection  . Dementia Sister      ROS:  Please see the history of present illness.   No fevers, chills, cough, melena, hematochezia, vomiting.  She does have diarrhea.  All other systems reviewed and negative.   PHYSICAL EXAM: VS:  BP 118/70  Pulse 52  Resp 20  Ht 5\' 6"  (1.676 m)  Wt 157 lb 12.8 oz (71.578 kg)  BMI 25.47 kg/m2  Well nourished, well developed, in no acute distress HEENT: normal Neck: + JVD at 90 degrees Cardiac:  Slow irregular rhythm; no murmur Lungs:  Bibasilar crackles, no wheezes Abd: soft, nontender  Ext: 1-2+ bilateral edema Skin: warm and dry Neuro:  CNs 2-12 intact, no focal abnormalities noted Psych: pleasant   EKG:  Sinus brady, HR 52, LAD, IVCD (? Inc LBBB)  ASSESSMENT AND PLAN:

## 2011-06-10 NOTE — Telephone Encounter (Signed)
I talked with pt's daughter, Juliette Alcide. She was asking about the Toprol XL dose. OV notes from 06/07/11 indicate to decrease Toprol XL to 25mg  daily. Pt's daughter is aware.

## 2011-06-12 ENCOUNTER — Ambulatory Visit
Admission: RE | Admit: 2011-06-12 | Discharge: 2011-06-12 | Disposition: A | Discharge status: Home / Self Care | Payer: Medicare Other | Source: Ambulatory Visit | Attending: Family Medicine | Admitting: Family Medicine

## 2011-06-12 ENCOUNTER — Other Ambulatory Visit: Payer: Self-pay | Admitting: Family Medicine

## 2011-06-12 DIAGNOSIS — M7989 Other specified soft tissue disorders: Secondary | ICD-10-CM

## 2011-06-13 ENCOUNTER — Ambulatory Visit (INDEPENDENT_AMBULATORY_CARE_PROVIDER_SITE_OTHER): Payer: Medicare Other | Admitting: Physician Assistant

## 2011-06-13 ENCOUNTER — Encounter: Payer: Self-pay | Admitting: Physician Assistant

## 2011-06-13 DIAGNOSIS — R0602 Shortness of breath: Secondary | ICD-10-CM

## 2011-06-13 DIAGNOSIS — I5023 Acute on chronic systolic (congestive) heart failure: Secondary | ICD-10-CM

## 2011-06-13 DIAGNOSIS — I4891 Unspecified atrial fibrillation: Secondary | ICD-10-CM

## 2011-06-13 DIAGNOSIS — R001 Bradycardia, unspecified: Secondary | ICD-10-CM

## 2011-06-13 DIAGNOSIS — I498 Other specified cardiac arrhythmias: Secondary | ICD-10-CM

## 2011-06-13 MED ORDER — FUROSEMIDE 40 MG PO TABS: 60.0000 mg | ORAL_TABLET | Freq: Two times a day (BID) | ORAL | Status: DC

## 2011-06-13 NOTE — Assessment & Plan Note (Signed)
Will also get 48-hour Holter to assess her heart rate and rule out significant pauses or bradycardia contributing to her symptoms.

## 2011-06-13 NOTE — Progress Notes (Signed)
[address] [place], [place]  [address] Phone: [phone] Fax:  [phone]  Date:  06/13/2011   Name:  Brianna Robertson       DOB:  [birth year] MRN:  [id number]  PCP:  Dr. Duaine Dredge Primary Cardiologist:  Dr. Marca Ancona  Primary Electrophysiologist:  None    History of Present Illness: Brianna Robertson is a 78 y.o. female who presents for follow up.  She has a history of dementia, atrial fibrillation, and possible tachycardia-mediated cardiomyopathy.  She went back into atrial fibrillation with rapid ventricular response in 12/11 and was hospitalized in [place]. Myoview showed no ischemia. Echo showed EF 20% with global hypokinesis. She was diuresed and rate was controlled. She was started on coumadin. No bleeding complcations since starting coumadin. Repeat echo in 2/12 with rate control showed some improvement in EF, 35-40%. There was moderate to severe mitral regurgitation and mild aortic stenosis.     She has been seen often over the last 1 week for a/c CHF in the setting of bradycardia.  She is now in NSR.  We stopped her dig and decreased Toprol to 25 mg QD.  We also increased her Lasix.  She is back today with a 2 lb weight loss.  Daughter still notes significant DOE (Class 3-3b).  Sleeps at an incline.  No PND.  No syncope.  Still noted to be lethargic.  Saw PCP recently.  RLE edema was worse.  Doppler was neg for DVT.  Also put on Keflex for cellulitis.    Creatinine, Ser  Date/Time Value Range Status  06/07/2011 12:40 PM 1.6* 0.4-1.2 (mg/dL) Final  05/29/2438  1:02 PM 1.5* 0.4-1.2 (mg/dL) Final  72/53/6644 03:47 AM 1.0  0.4-1.2 (mg/dL) Final  42/59/5638 75:64 AM 1.2  0.4-1.2 (mg/dL) Final   Potassium  Date Value Range Status  06/07/2011 3.9  3.5-5.1 (mEq/L) Final  06/05/2011 3.7  3.5-5.1 (mEq/L) Final  05/15/2011 3.9  3.5-5.1 (mEq/L) Final  04/17/2011 4.5  3.5-5.1 (mEq/L) Final    Past Medical History:  1. Breast cancer, hx of 1995. S/P  lumpectomy and radiation (node, ER, PR negative).  2. Hypertension  3. Hypothyroidism  4. 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 Patient was cardioverted and maintained on amiodarone, and EF improved to 40%. Patient stopped amiodarone because of anorexia and stopped coumadin due to rectus sheath hematoma. Atrial fib with RVR recurred in 12/11. EF down to 20% on echo. Patient was rate-controlled and coumadin was restarted.  5. Moderate dementia  6. CKD: mild  7. Cardiomyopathy: Possibly tachycardia-mediated. In 4/07 in setting of atrial fibrillation with RVR, EF was 30%. After conversion to NSR, EF 40% in 8/09. In 12/11, noted to be back in atrial fibrillation with RVR. Echo (12/11) with EF 20%, severe global hypokinesis, mild to moderate MR, PA systolic pressure 39 mmHg, mild to moderate AS with mean gradient of 13 (may be low gradient AS in the setting of decreased cardiac output). Myoview in 4/07 was negative for ischemia or infarction. Myoview (12/11) showed small fixed apical defect likely due to apical thinning. No ischemia. Echo (2/12): EF 35% with diffuse hypokinesis (worse anteroseptally), mild AS (mean gradient 16 mmHg), mild AI, moderate to severe MR, moderate biatrial enlargement, normal RV, PA systolic pressure 40 mmHg.  7. Hyperlipidemia  8. Pulmonary Nodule - stable 02/08  9. LBBB  10. Aortic stenosis: mild.  11. MItral regurgitation: moderate to severe.  12. Asthma   Current Outpatient Prescriptions  Medication Sig Dispense Refill  . anastrozole (ARIMIDEX) 1 MG tablet Take 1 mg by mouth daily.        . calcium carbonate (OS-CAL) 600 MG TABS Take 600 mg by mouth 2 (two) times daily with a meal.        . carvedilol (COREG) 12.5 MG tablet Take 12.5 mg by mouth 2 (two) times daily with a meal.       . cephALEXin (KEFLEX) 500 MG capsule       . citalopram (CELEXA) 20 MG tablet Take 20 mg by mouth daily.        Marland Kitchen donepezil  (ARICEPT) 10 MG tablet Take 10 mg by mouth at bedtime as needed.        . furosemide (LASIX) 40 MG tablet Take 1 tablet (40 mg total) by mouth 2 (two) times daily.  60 tablet  6  . levothyroxine (SYNTHROID, LEVOTHROID) 25 MCG tablet Take 25 mcg by mouth daily.        . memantine (NAMENDA TITRATION PACK) tablet pack Take 5 mg by mouth daily.        . metoprolol succinate (TOPROL-XL) 25 MG 24 hr tablet Take 1 tablet (25 mg total) by mouth daily.  60 tablet  11  . nystatin (MYCOSTATIN) 100000 UNIT/ML suspension Take by mouth 2 (two) times daily.       . potassium chloride SA (KLOR-CON M20) 20 MEQ tablet Take 1 tablet (20 mEq total) by mouth 2 (two) times daily.  60 tablet  6  . simvastatin (ZOCOR) 20 MG tablet Take 20 mg by mouth at bedtime.        Marland Kitchen tiotropium (SPIRIVA) 18 MCG inhalation capsule Place 18 mcg into inhaler and inhale daily.        Marland Kitchen triamcinolone cream (KENALOG) 0.1 % Apply 1 application topically 2 (two) times daily.       . valsartan (DIOVAN) 80 MG tablet Take 1/2 tablet daily      . warfarin (COUMADIN) 2.5 MG tablet Take 2.5 mg by mouth as directed. Take 2 tablets by mouth as directed       . warfarin (COUMADIN) 2.5 MG tablet  TODAY TAKE 3 TABLETS, THEN BEGIN  3 TABLETS EVERY DAY EXCEPT  2 TABLETS ON MONDAYS, WEDNESDAYS AND FRIDAYS. RECHECK IN 2 WEEKS.  70 tablet  2    Allergies: Allergies  Allergen Reactions  . Penicillins   . Sulfonamide Derivatives     History  Substance Use Topics  . Smoking status: Never Smoker   . Smokeless tobacco: Never Used  . Alcohol Use: No     very infrequently     Family History  Problem Relation Age of Onset  . Liver cancer Brother   . Heart attack Father 49  . Cancer    . Arthritis    . Heart disease Mother     CHF  . Dementia Mother   . Heart attack Brother   . Other Brother     staph infection  . Dementia Sister      ROS:  Please see the history of present illness.   No fevers, chills, melena, diarrhea, vomiting.  She  notes a dry cough.  All other systems reviewed and negative.   PHYSICAL EXAM: VS:  BP 133/70  Pulse 49  Resp 18  Ht 5' (1.524 m)  Wt 155 lb 6.4 oz (70.489 kg)  BMI 30.35 kg/m2  Well nourished, well developed, in no acute distress HEENT: normal Neck: + JVD  at 90 degrees Cardiac:  RRR Lungs:  Bibasilar crackles, no wheezes Abd: soft, nontender  Ext: 1+ bilateral edema; minimal scattered erythema on RLE Skin: warm and dry Neuro:  CNs 2-12 intact, no focal abnormalities noted Psych: pleasant   EKG:  Sinus bradycardia, heart rate 49, interventricular conduction delay  ASSESSMENT AND PLAN:

## 2011-06-13 NOTE — Patient Instructions (Signed)
Your physician recommends that you schedule a follow-up appointment in: 1 week with Dr Shirlee Latch Your physician recommends that you return for lab work in: today and in 1 week (BMP and BNP)  A chest x-ray takes a picture of the organs and structures inside the chest, including the heart, lungs, and blood vessels. This test can show several things, including, whether the heart is enlarges; whether fluid is building up in the lungs; and whether pacemaker / defibrillator leads are still in place.   Your physician has recommended that you wear a holter monitor. Holter monitors are medical devices that record the heart's electrical activity. Doctors most often use these monitors to diagnose arrhythmias. Arrhythmias are problems with the speed or rhythm of the heartbeat. The monitor is a small, portable device. You can wear one while you do your normal daily activities. This is usually used to diagnose what is causing palpitations/syncope (passing out).  Your physician has recommended you make the following change in your medication: INCREASE Furosemide to 1 1/2 twice daily

## 2011-06-13 NOTE — Assessment & Plan Note (Signed)
Symptomatically, she is not improved.  However, her weight is down in his swelling is down.  She still has an abnormal lung exam.  She was also seen by Dr. Marca Ancona today.  We will increase her Lasix to 60 mg twice a day.  Check a basic metabolic panel and BNP today.  Followup with repeat labs in one week.  Also get a chest x-ray given her abnormal lung exam and continued shortness of breath.  She's also had a cough.  She will be brought back in all with Dr. Marca Ancona in one week or me on a day he is here.

## 2011-06-14 ENCOUNTER — Telehealth: Payer: Self-pay | Admitting: *Deleted

## 2011-06-14 ENCOUNTER — Other Ambulatory Visit: Payer: Self-pay | Admitting: Family Medicine

## 2011-06-14 ENCOUNTER — Telehealth: Payer: Self-pay | Admitting: Cardiology

## 2011-06-14 ENCOUNTER — Ambulatory Visit (INDEPENDENT_AMBULATORY_CARE_PROVIDER_SITE_OTHER)
Admission: RE | Admit: 2011-06-14 | Discharge: 2011-06-14 | Disposition: A | Discharge status: Home / Self Care | Payer: Medicare Other | Source: Ambulatory Visit | Attending: Physician Assistant | Admitting: Physician Assistant

## 2011-06-14 DIAGNOSIS — R630 Anorexia: Secondary | ICD-10-CM

## 2011-06-14 DIAGNOSIS — I4891 Unspecified atrial fibrillation: Secondary | ICD-10-CM

## 2011-06-14 DIAGNOSIS — I498 Other specified cardiac arrhythmias: Secondary | ICD-10-CM

## 2011-06-14 DIAGNOSIS — R748 Abnormal levels of other serum enzymes: Secondary | ICD-10-CM

## 2011-06-14 LAB — BASIC METABOLIC PANEL
CO2: 36 mEq/L — ABNORMAL HIGH (ref 19–32)
Calcium: 9.1 mg/dL (ref 8.4–10.5)
Creatinine, Ser: 1.1 mg/dL (ref 0.4–1.2)
GFR: 50.85 mL/min — ABNORMAL LOW (ref >60.00)
Glucose, Bld: 88 mg/dL (ref 70–99)
Sodium: 140 mEq/L (ref 135–145)

## 2011-06-14 NOTE — Telephone Encounter (Signed)
06/14/11--1600p--spoke with pt's daughter and gave instructions sent to me by scott weaver--if weight goes up by 3 lbs. In 1 day,if becomes more SOB, or if swelling increases, please call us --pt's daughter agrees--nt

## 2011-06-14 NOTE — Telephone Encounter (Signed)
Spoke with pt's daughter.  Clarified Coumadin dosing for pt.

## 2011-06-14 NOTE — Telephone Encounter (Signed)
New problem:  Please call back to discuss coumadin medication.

## 2011-06-17 NOTE — Telephone Encounter (Signed)
Ok Darius Fillingim, PA-C  1:16 PM 06/17/2011   

## 2011-06-19 ENCOUNTER — Ambulatory Visit
Admission: RE | Admit: 2011-06-19 | Discharge: 2011-06-19 | Disposition: A | Discharge status: Home / Self Care | Payer: Medicare Other | Source: Ambulatory Visit | Attending: Family Medicine | Admitting: Family Medicine

## 2011-06-19 DIAGNOSIS — R630 Anorexia: Secondary | ICD-10-CM

## 2011-06-19 DIAGNOSIS — R748 Abnormal levels of other serum enzymes: Secondary | ICD-10-CM

## 2011-06-20 ENCOUNTER — Encounter: Payer: Self-pay | Admitting: Physician Assistant

## 2011-06-20 ENCOUNTER — Ambulatory Visit (INDEPENDENT_AMBULATORY_CARE_PROVIDER_SITE_OTHER): Payer: Medicare Other | Admitting: Physician Assistant

## 2011-06-20 ENCOUNTER — Other Ambulatory Visit: Payer: Medicare Other | Admitting: *Deleted

## 2011-06-20 VITALS — BP 122/60 | HR 70 | Resp 19 | Ht 62.0 in | Wt 150.8 lb

## 2011-06-20 DIAGNOSIS — I5022 Chronic systolic (congestive) heart failure: Secondary | ICD-10-CM

## 2011-06-20 DIAGNOSIS — I4891 Unspecified atrial fibrillation: Secondary | ICD-10-CM

## 2011-06-20 MED ORDER — METOPROLOL SUCCINATE ER 50 MG PO TB24: 50.0000 mg | ORAL_TABLET | Freq: Every day | ORAL | Status: DC

## 2011-06-20 NOTE — Patient Instructions (Signed)
Your physician recommends that you schedule a follow-up appointment in: 1 MONTH WITH DR. [facility] PER Tereso Newcomer, PA-C  Your physician recommends that you return for lab work in: TODAY BMET 427.31, 119.14  Your physician has recommended you make the following change in your medication: INCREASE TOPROL XL 50 MG DAILY

## 2011-06-20 NOTE — Assessment & Plan Note (Signed)
She is now back in atrial fibrillation.  Increase Toprol to 50 mg daily.

## 2011-06-20 NOTE — Assessment & Plan Note (Signed)
Volume improved.  This is likely related to increased heart rate from being back in atrial fibrillation.  Increase Toprol as noted.  Continue current dose of Lasix.  Followup basic metabolic panel today.  Patient also seen by Dr. Marca Ancona

## 2011-06-20 NOTE — Progress Notes (Signed)
[address] [place], [place]  [address] Phone: [phone] Fax:  [phone]  Date:  06/20/2011   Name:  Brianna Robertson       DOB:  07-20-1925 MRN:  [id number]  PCP:  Dr. Duaine Dredge Primary Cardiologist:  Dr. Marca Ancona  Primary Electrophysiologist:  None    History of Present Illness: Brianna Robertson is a 78 y.o. female who presents for follow up.  She has a history of dementia, atrial fibrillation, and possible tachycardia-mediated cardiomyopathy.  She went back into atrial fibrillation with rapid ventricular response in 12/11 and was hospitalized in [place]. Myoview showed no ischemia. Echo showed EF 20% with global hypokinesis. She was diuresed and rate was controlled. She was started on coumadin. No bleeding complcations since starting coumadin. Repeat echo in 2/12 with rate control showed some improvement in EF, 35-40%. There was moderate to severe mitral regurgitation and mild aortic stenosis.     She has been seen often over the last couple of weeks for a/c CHF in the setting of bradycardia.  She is now in NSR.  We stopped her dig and decreased Toprol to 25 mg QD.  We also increased her Lasix.  Saw her with Dr. Marca Ancona last week.  Lasix increased again to 60 mg bid for continued volume overload.  Follow up creatinine improved.  She was placed on a Holter to assess heart rate.  Brought in today for early follow up.  Creatinine, Ser  Date/Time Value Range Status  06/13/2011  4:59 PM 1.1  0.4-1.2 (mg/dL) Final  6/96/2952 84:13 PM 1.6* 0.4-1.2 (mg/dL) Final  06/30/4008  2:72 PM 1.5* 0.4-1.2 (mg/dL) Final  53/66/4403 47:42 AM 1.0  0.4-1.2 (mg/dL) Final   Feels much better.  The patient denies chest pain, significant shortness of breath, syncope, orthopnea, PND or significant pedal edema.   Past Medical History:  1. Breast cancer, hx of 1995. S/P lumpectomy and radiation (node, ER, PR negative).  2. Hypertension  3. Hypothyroidism  4.  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 Patient was cardioverted and maintained on amiodarone, and EF improved to 40%. Patient stopped amiodarone because of anorexia and stopped coumadin due to rectus sheath hematoma. Atrial fib with RVR recurred in 12/11. EF down to 20% on echo. Patient was rate-controlled and coumadin was restarted.  5. Moderate dementia  6. CKD: mild  7. Cardiomyopathy: Possibly tachycardia-mediated. In 4/07 in setting of atrial fibrillation with RVR, EF was 30%. After conversion to NSR, EF 40% in 8/09. In 12/11, noted to be back in atrial fibrillation with RVR. Echo (12/11) with EF 20%, severe global hypokinesis, mild to moderate MR, PA systolic pressure 39 mmHg, mild to moderate AS with mean gradient of 13 (may be low gradient AS in the setting of decreased cardiac output). Myoview in 4/07 was negative for ischemia or infarction. Myoview (12/11) showed small fixed apical defect likely due to apical thinning. No ischemia. Echo (2/12): EF 35% with diffuse hypokinesis (worse anteroseptally), mild AS (mean gradient 16 mmHg), mild AI, moderate to severe MR, moderate biatrial enlargement, normal RV, PA systolic pressure 40 mmHg.  7. Hyperlipidemia  8. Pulmonary Nodule - stable 02/08  9. LBBB  10. Aortic stenosis: mild.  11. MItral regurgitation: moderate to severe.  12. Asthma   Current Outpatient Prescriptions  Medication Sig Dispense Refill  . anastrozole (ARIMIDEX) 1 MG tablet Take 1 mg by mouth daily.        . calcium carbonate (  OS-CAL) 600 MG TABS Take 600 mg by mouth 2 (two) times daily with a meal.        . cephALEXin (KEFLEX) 500 MG capsule       . citalopram (CELEXA) 20 MG tablet Take 20 mg by mouth daily.        Marland Kitchen donepezil (ARICEPT) 10 MG tablet Take 10 mg by mouth at bedtime as needed.        . furosemide (LASIX) 40 MG tablet Take 1.5 tablets (60 mg total) by mouth 2 (two) times daily.  90 tablet  6  .  levothyroxine (SYNTHROID, LEVOTHROID) 25 MCG tablet Take 25 mcg by mouth daily.        . memantine (NAMENDA TITRATION PACK) tablet pack Take 5 mg by mouth daily.        . metoprolol succinate (TOPROL-XL) 25 MG 24 hr tablet Take 1 tablet (25 mg total) by mouth daily.  60 tablet  11  . nystatin (MYCOSTATIN) 100000 UNIT/ML suspension Take by mouth 2 (two) times daily.       . potassium chloride SA (KLOR-CON M20) 20 MEQ tablet Take 1 tablet (20 mEq total) by mouth 2 (two) times daily.  60 tablet  6  . simvastatin (ZOCOR) 20 MG tablet Take 20 mg by mouth at bedtime.        Marland Kitchen tiotropium (SPIRIVA) 18 MCG inhalation capsule Place 18 mcg into inhaler and inhale daily.        Marland Kitchen triamcinolone cream (KENALOG) 0.1 % Apply 1 application topically 2 (two) times daily.       . valsartan (DIOVAN) 80 MG tablet Take 1/2 tablet daily      . warfarin (COUMADIN) 2.5 MG tablet Take 2.5 mg by mouth as directed. Take 2 tablets by mouth as directed         Allergies: Allergies  Allergen Reactions  . Penicillins   . Sulfonamide Derivatives     History  Substance Use Topics  . Smoking status: Never Smoker   . Smokeless tobacco: Never Used  . Alcohol Use: No     very infrequently     ROS:  Please see the history of present illness.   All other systems reviewed and negative.   PHYSICAL EXAM: VS:  BP 122/60  Pulse 70  Resp 19  Ht 5\' 2"  (1.575 m)  Wt 150 lb 12.8 oz (68.402 kg)  BMI 27.58 kg/m2  Well nourished, well developed, in no acute distress HEENT: normal Neck: no JVF Cardiac:  S1, S2; irreg irreg;  Lungs:  Clear to ausc bilat, no rales Abd: soft, nontender  Ext: trace to 1+ bilateral edema  Skin: warm and dry Neuro:  CNs 2-12 intact, no focal abnormalities noted Psych: pleasant   EKG:  AFib, HR 98  ASSESSMENT AND PLAN:

## 2011-06-21 LAB — BASIC METABOLIC PANEL
BUN: 17 mg/dL (ref 6–23)
CO2: 37 mEq/L — ABNORMAL HIGH (ref 19–32)
Calcium: 9.5 mg/dL (ref 8.4–10.5)
Glucose, Bld: 93 mg/dL (ref 70–99)
Sodium: 139 mEq/L (ref 135–145)

## 2011-06-24 ENCOUNTER — Telehealth: Payer: Self-pay | Admitting: *Deleted

## 2011-06-24 NOTE — Telephone Encounter (Signed)
lmom on daughter's cell # for pt labs ok. Danielle Rankin

## 2011-07-04 ENCOUNTER — Telehealth: Payer: Self-pay | Admitting: *Deleted

## 2011-07-04 DIAGNOSIS — I5022 Chronic systolic (congestive) heart failure: Secondary | ICD-10-CM

## 2011-07-04 DIAGNOSIS — R0602 Shortness of breath: Secondary | ICD-10-CM

## 2011-07-04 NOTE — Telephone Encounter (Signed)
F/U   Patient Daughter  Brianna Robertson [phone] returned nurse call

## 2011-07-04 NOTE — Telephone Encounter (Signed)
Discussed with daughter. Lab appt for BMET/BNP scheduled for 07/12/11.

## 2011-07-04 NOTE — Telephone Encounter (Signed)
Torsemide dose is 60mg  daily

## 2011-07-04 NOTE — Telephone Encounter (Signed)
Dr Duaine Dredge called Dr Shirlee Latch about pt today. Dr Duaine Dredge thought pt may be allergic to furosemide. Furosemide was stopped and torsemide was started. Dr Shirlee Latch recommended BMET/BNP in 1 week. LMTCB for pt's daughter,Lisa to schedule this.

## 2011-07-10 ENCOUNTER — Encounter: Payer: Medicare Other | Admitting: *Deleted

## 2011-07-12 ENCOUNTER — Other Ambulatory Visit (INDEPENDENT_AMBULATORY_CARE_PROVIDER_SITE_OTHER): Payer: Medicare Other | Admitting: *Deleted

## 2011-07-12 DIAGNOSIS — R0602 Shortness of breath: Secondary | ICD-10-CM

## 2011-07-12 DIAGNOSIS — I5022 Chronic systolic (congestive) heart failure: Secondary | ICD-10-CM

## 2011-07-12 LAB — BRAIN NATRIURETIC PEPTIDE: Pro B Natriuretic peptide (BNP): 325 pg/mL — ABNORMAL HIGH (ref 0.0–100.0)

## 2011-07-12 LAB — BASIC METABOLIC PANEL
BUN: 18 mg/dL (ref 6–23)
CO2: 37 mEq/L — ABNORMAL HIGH (ref 19–32)
Calcium: 9.5 mg/dL (ref 8.4–10.5)
Chloride: 97 mEq/L (ref 96–112)
Creatinine, Ser: 1.3 mg/dL — ABNORMAL HIGH (ref 0.4–1.2)

## 2011-07-18 ENCOUNTER — Ambulatory Visit (INDEPENDENT_AMBULATORY_CARE_PROVIDER_SITE_OTHER): Payer: Medicare Other

## 2011-07-18 ENCOUNTER — Ambulatory Visit (INDEPENDENT_AMBULATORY_CARE_PROVIDER_SITE_OTHER): Payer: Medicare Other | Admitting: Cardiology

## 2011-07-18 ENCOUNTER — Encounter: Payer: Self-pay | Admitting: Cardiology

## 2011-07-18 VITALS — BP 106/56 | HR 64 | Ht 62.0 in | Wt 151.0 lb

## 2011-07-18 DIAGNOSIS — Z7901 Long term (current) use of anticoagulants: Secondary | ICD-10-CM

## 2011-07-18 DIAGNOSIS — I4891 Unspecified atrial fibrillation: Secondary | ICD-10-CM

## 2011-07-18 DIAGNOSIS — I5022 Chronic systolic (congestive) heart failure: Secondary | ICD-10-CM

## 2011-07-18 NOTE — Patient Instructions (Signed)
Your physician recommends that you return for lab work in: 1 month BMET  Your physician recommends that you schedule a follow-up appointment in: 2 months with Dr Shirlee Latch.

## 2011-07-21 NOTE — Progress Notes (Signed)
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  She has a history of dementia, atrial fibrillation, and possible tachycardia-mediated cardiomyopathy.  She went back into atrial fibrillation with rapid ventricular response in 12/11 and was hospitalized in [place]. Myoview showed no ischemia. Echo showed EF 20% with global hypokinesis. She was diuresed and rate was controlled. She was started on coumadin. No bleeding complcations since starting coumadin. Repeat echo in 2/12 with rate control showed some improvement in EF, 35-40%. There was moderate to severe mitral regurgitation and mild aortic stenosis.     She has been seen often over the last couple of months for acute on chronic CHF in the setting of bradycardia.  She is now in NSR.  We stopped her digoxin and titrated her Lasix.  She developed a rash on her legs and was switched from Lasix to torsemide given concern that Lasix could have caused the rash.   The rash is better but still present.  Breathing seems much improved and back to baseline.  No dyspnea walking around her house.  She had had a chronic cough which seems to have resolved.  No chest pain.    Labs (2/13): K 4.6, creatinine 1.3, BNP 501 => 325  Past Medical History:  1. Breast cancer, hx of 1995. S/P lumpectomy and radiation (node, ER, PR negative).  2. Hypertension  3. Hypothyroidism  4. 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 Patient was cardioverted and maintained on amiodarone, and EF improved to 40%. Patient stopped amiodarone because of anorexia and stopped coumadin due to rectus sheath hematoma. Atrial fib with RVR recurred in 12/11. EF down to 20% on echo. Patient was rate-controlled and coumadin was restarted.  EF 35-40% on echo in 2/12.  5. Moderate dementia  6. CKD: mild  7. Cardiomyopathy: Possibly tachycardia-mediated. In 4/07 in setting of atrial fibrillation with RVR, EF  was 30%. After conversion to NSR, EF 40% in 8/09. In 12/11, noted to be back in atrial fibrillation with RVR. Echo (12/11) with EF 20%, severe global hypokinesis, mild to moderate MR, PA systolic pressure 39 mmHg, mild to moderate AS with mean gradient of 13 (may be low gradient AS in the setting of decreased cardiac output). Myoview in 4/07 was negative for ischemia or infarction. Myoview (12/11) showed small fixed apical defect likely due to apical thinning. No ischemia. Echo (2/12): EF 35% with diffuse hypokinesis (worse anteroseptally), mild AS (mean gradient 16 mmHg), mild AI, moderate to severe MR, moderate biatrial enlargement, normal RV, PA systolic pressure 40 mmHg.  7. Hyperlipidemia  8. Pulmonary Nodule - stable 02/08  9. LBBB  10. Aortic stenosis: mild.  11. MItral regurgitation: moderate to severe.  12. Asthma 13. Carotid dopplers (9/12): 0-39% bilateral ICA stenosis.    Current Outpatient Prescriptions  Medication Sig Dispense Refill  . anastrozole (ARIMIDEX) 1 MG tablet Take 1 mg by mouth daily.        . calcium carbonate (OS-CAL) 600 MG TABS Take 600 mg by mouth 2 (two) times daily with a meal.        . citalopram (CELEXA) 20 MG tablet Take 20 mg by mouth daily.        . Cyanocobalamin (B-12) 2000 MCG TABS Take 1 tablet by mouth daily.      Marland Kitchen donepezil (ARICEPT) 10 MG tablet Take 10 mg by mouth at bedtime as needed.        . fluocinonide  cream (LIDEX) 0.05 % as directed.      Marland Kitchen letrozole (FEMARA) 2.5 MG tablet Take 1 tablet by mouth Daily.      Marland Kitchen levothyroxine (SYNTHROID, LEVOTHROID) 25 MCG tablet Take 25 mcg by mouth daily.        . memantine (NAMENDA TITRATION PACK) tablet pack Take 5 mg by mouth daily.        . metoprolol succinate (TOPROL-XL) 50 MG 24 hr tablet Take 1 tablet (50 mg total) by mouth daily.  30 tablet  11  . potassium chloride SA (KLOR-CON M20) 20 MEQ tablet Take 1 tablet (20 mEq total) by mouth 2 (two) times daily.  60 tablet  6  . simvastatin (ZOCOR) 20 MG  tablet Take 20 mg by mouth at bedtime.        Marland Kitchen tiotropium (SPIRIVA) 18 MCG inhalation capsule Place 18 mcg into inhaler and inhale daily.        Marland Kitchen torsemide (DEMADEX) 20 MG tablet Take 60 mg by mouth every morning. 3 tablets daily      . valsartan (DIOVAN) 80 MG tablet Take 1/2 tablet daily      . warfarin (COUMADIN) 2.5 MG tablet Take 2.5 mg by mouth as directed. Take 2 tablets by mouth as directed         Allergies: Allergies  Allergen Reactions  . Lasix (Furosemide)     Discontinued by Dr Duaine Dredge  . Penicillins   . Sulfonamide Derivatives     History  Substance Use Topics  . Smoking status: Never Smoker   . Smokeless tobacco: Never Used  . Alcohol Use: No     very infrequently    Family History:  Family hx of heart disease,cancer, and arthritis.  Brother- pancreatic cancer  ROS:  Please see the history of present illness.   All other systems reviewed and negative.   PHYSICAL EXAM: VS:  BP 106/56  Pulse 64  Ht 5\' 2"  (1.575 m)  Wt 151 lb (68.493 kg)  BMI 27.62 kg/m2  Well nourished, well developed, in no acute distress HEENT: normal Neck: no JVD Cardiac:  S1, S2; irreg irreg; 3/6 HSM at apex.  Lungs:  Clear to ausc bilat, no rales Abd: soft, nontender  Ext: 1+ ankle edema 1/3 of way to knees bilaterally.  Skin: warm and dry Neuro:  CNs 2-12 intact, no focal abnormalities noted Psych: pleasant

## 2011-07-21 NOTE — Assessment & Plan Note (Signed)
Rate controlled.  Continue coumadin.  If falling becomes more evident may need to reconsider whether she should stay on coumadin going forward.  Continue Toprol XL; she seems to be tolerating this well.

## 2011-07-21 NOTE — Assessment & Plan Note (Signed)
Chronic systolic heart failure. EF 35% with moderate to severe MR on most recent echo.  NYHA class II symptoms, improved.  She does not appear significantly volume overloaded on exam.  Cardiomyopathy may be tachycardia-mediated.  HR is now in the 60 - 70s.   - BMET in 1 month.  - Continue Toprol XL, torsemide at current dose, and valsartan.

## 2011-07-29 ENCOUNTER — Other Ambulatory Visit: Payer: Self-pay | Admitting: Cardiology

## 2011-07-29 ENCOUNTER — Other Ambulatory Visit: Payer: Self-pay

## 2011-07-29 ENCOUNTER — Other Ambulatory Visit: Payer: Self-pay | Admitting: *Deleted

## 2011-07-29 MED ORDER — WARFARIN SODIUM 2.5 MG PO TABS: 2.5000 mg | ORAL_TABLET | ORAL | Status: DC

## 2011-08-02 ENCOUNTER — Other Ambulatory Visit: Payer: Self-pay | Admitting: *Deleted

## 2011-08-02 MED ORDER — WARFARIN SODIUM 2.5 MG PO TABS: 2.5000 mg | ORAL_TABLET | ORAL | Status: DC

## 2011-08-03 ENCOUNTER — Emergency Department (HOSPITAL_COMMUNITY)
Admission: EM | Admit: 2011-08-03 | Discharge: 2011-08-04 | Disposition: A | Discharge status: Home / Self Care | Payer: Medicare Other | Attending: Emergency Medicine | Admitting: Emergency Medicine

## 2011-08-03 ENCOUNTER — Encounter (HOSPITAL_COMMUNITY): Payer: Self-pay | Admitting: *Deleted

## 2011-08-03 ENCOUNTER — Emergency Department (HOSPITAL_COMMUNITY): Payer: Medicare Other

## 2011-08-03 DIAGNOSIS — R4182 Altered mental status, unspecified: Secondary | ICD-10-CM | POA: Insufficient documentation

## 2011-08-03 DIAGNOSIS — F0390 Unspecified dementia without behavioral disturbance: Secondary | ICD-10-CM

## 2011-08-03 DIAGNOSIS — Z853 Personal history of malignant neoplasm of breast: Secondary | ICD-10-CM | POA: Insufficient documentation

## 2011-08-03 DIAGNOSIS — Z7901 Long term (current) use of anticoagulants: Secondary | ICD-10-CM | POA: Insufficient documentation

## 2011-08-03 DIAGNOSIS — I4891 Unspecified atrial fibrillation: Secondary | ICD-10-CM | POA: Insufficient documentation

## 2011-08-03 DIAGNOSIS — I10 Essential (primary) hypertension: Secondary | ICD-10-CM | POA: Insufficient documentation

## 2011-08-03 DIAGNOSIS — E039 Hypothyroidism, unspecified: Secondary | ICD-10-CM | POA: Insufficient documentation

## 2011-08-03 LAB — URINALYSIS, ROUTINE W REFLEX MICROSCOPIC
Bilirubin Urine: NEGATIVE
Hgb urine dipstick: NEGATIVE
Specific Gravity, Urine: 1.013 (ref 1.005–1.030)
pH: 6.5 (ref 5.0–8.0)

## 2011-08-03 LAB — URINE MICROSCOPIC-ADD ON

## 2011-08-03 MED ORDER — SODIUM CHLORIDE 0.9 % IV SOLN
INTRAVENOUS | Status: DC
  Administered 2011-08-04: 01:00:00 via INTRAVENOUS

## 2011-08-03 NOTE — ED Notes (Signed)
Patient brought by EMS for altered mental status.  Patient is confused and unable to answer questions.  No one with patient at this time.  Patient is alert but confused.
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
## 2011-08-04 LAB — CBC
Platelets: 193 10*3/uL (ref 150–400)
RBC: 3.57 MIL/uL — ABNORMAL LOW (ref 3.87–5.11)
WBC: 4.1 10*3/uL (ref 4.0–10.5)

## 2011-08-04 LAB — BASIC METABOLIC PANEL
CO2: 39 mEq/L — ABNORMAL HIGH (ref 19–32)
Calcium: 10 mg/dL (ref 8.4–10.5)
Chloride: 95 mEq/L — ABNORMAL LOW (ref 96–112)
Potassium: 4 mEq/L (ref 3.5–5.1)
Sodium: 137 mEq/L (ref 135–145)

## 2011-08-04 LAB — PROTIME-INR
INR: 3.28 — ABNORMAL HIGH (ref 0.00–1.49)
Prothrombin Time: 33.9 seconds — ABNORMAL HIGH (ref 11.6–15.2)

## 2011-08-04 NOTE — ED Notes (Signed)
MD at bedside. 

## 2011-08-04 NOTE — Discharge Instructions (Signed)
Alzheimer's Disease, Caregiver Guide  Caring for a person with Alzheimer's disease (AD) at home is a difficult task and can become overwhelming at times. Each day brings new challenges as the caregiver copes with changing levels of ability and new patterns of behavior. Research has shown that caregivers themselves often are at increased risk for depression and illness, especially if they do not receive adequate support from family, friends, and the community.  One of the biggest struggles caregivers face is dealing with the difficult behaviors of the person they are caring for. Dressing, bathing, eating, and basic activities of daily living often become difficult to manage for both the person with AD and the caregiver. Having a plan for getting through the day can help caregivers cope. Many caregivers have found it helpful to use strategies for dealing with difficult behaviors and stressful situations. Following are some suggestions to consider when faced with difficult aspects of caring for a person with AD.  DEALING WITH THE DIAGNOSIS  Finding out that a loved one has Alzheimer's disease can be stressful, frightening, and overwhelming. As you begin to take stock of the situation, here are some tips that may help:  Ask the doctor any questions you have about AD. Find out what treatments might work best to lessen symptoms or to address behavior problems.  Contact organizations such as the Alzheimer's Association and the Alzheimer's Disease Education and Referral (ADEAR) Center for more information about the disease, treatment options, and caregiving resources. Some community groups may offer classes to teach caregiving, problem solving, and management skills.  Find a support group where you can share your feelings and concerns. Members of support groups often have helpful ideas or know of useful resources based on their own experiences. Online support groups make it possible for caregivers to receive support  without having to leave home.  Study your day to see if you can develop a routine that makes things go more smoothly. If there are times of day when the person with AD is less confused or more cooperative, plan your routine to make the most of those moments. Keep in mind that the way the person functions may change from day to day, so try to be flexible and adapt your routine as needed.  Consider using adult day care or respite services to ease the day-to-day demands of caregiving. These services allow you to have a break while knowing that the person with AD is being well cared for.  Begin to plan for the future. This may include getting financial and legal documents in order, investigating long-term care options, and determining what services are covered by health insurance and Medicare.  COMMUNICATION  Trying to communicate with a person who has AD can be a challenge. Both understanding and being understood may be difficult.  Choose simple words and short sentences and use a gentle, calm tone of voice.  Avoid talking to the person with AD like a baby or talking about the person as if he or she was not there.  Minimize distractions and noise, such as the television or radio, to help the person focus on what you are saying.  Call the person by name, making sure you have his or her attention before speaking.  Allow enough time for a response. Be careful not to interrupt.  If the person with AD is struggling to find a word or communicate a thought, gently try to provide the word he or she is looking for.  Try to frame questions  and instructions in a positive way.  BATHING  While some people with AD do not mind bathing, for others it is a frightening, confusing experience. Advance planning can help make bath time better for both of you.  Plan the bath or shower for the time of day when the person is most calm and agreeable. Be consistent. Try to develop a routine.  Respect the fact that bathing is  scary and uncomfortable for some people with AD. Be gentle and respectful. Be patient and calm.  Tell the person what you are going to do, step-by-step, and allow him or her to do as much as possible.  Prepare in advance. Make sure you have everything you need ready and in the bathroom before beginning. Draw the bath ahead of time.  Be sensitive to the temperature. Warm up the room beforehand, if necessary, and keep extra towels and a robe nearby. Test the water temperature before beginning the bath or shower.  Minimize safety risks by using a handheld showerhead, shower bench, grab bars, and nonskid bath mats. Never leave the person alone in the bath or shower.  Try a sponge bath. Bathing may not be necessary every day. A sponge bath can be effective between showers or baths.  DRESSING  For someone who has AD, getting dressed presents a series of challenges: choosing what to wear, getting some clothes off and other clothes on, and struggling with buttons and zippers. Minimizing the challenges may make a difference.  Try to have the person get dressed at the same time each day so he or she will come to expect it as part of the daily routine.  Encourage the person to dress himself or herself to whatever degree possible. Plan to allow extra time so there is no pressure or rush.  Allow the person to choose from a limited selection of outfits. If he or she has a favorite outfit, consider buying several identical sets.  Arrange the clothes in the order they are to be put on, to help the person move through the process.  Provide clear, step-by-step instructions if the person needs prompting.  Choose clothing that is comfortable, easy to get on and off, and easy to care for. Elastic waists and Velcro enclosures minimize struggles with buttons and zippers.  EATING  Eating can be a challenge. Some people with AD want to eat all the time, while others need to be encouraged to maintain a good diet.  Ensure a  quiet, calm atmosphere for eating. Limiting noise and other distractions may help the person focus on the meal.  Provide a limited number of choices of food and serve small portions. You may want to offer several small meals throughout the day in place of three larger ones.  Use straws or cups with lids to make drinking easier.  Substitute finger foods if the person struggles with utensils. Using a bowl instead of a plate may also help.  Have healthy snacks on hand. To encourage eating, keep the snacks where they can be seen.  Visit the dentist regularly to keep mouth and teeth healthy.  ACTIVITIES  What to do all day? Finding activities that the person with AD can do can be a challenge. Building on current skills generally works better than trying to teach something new.  Do not expect too much. Simple activities often are best, especially when they use current abilities.  Help the person get started on an activity. Break the activity down into small steps and  praise the person for each step he or she completes.  Watch for signs of agitation or frustration with an activity. Gently help or distract the person to something else.  Incorporate activities the person seems to enjoy into your daily routine and try to do them at a similar time each day.  Take advantage of adult daycare services, which provide various activities for the person with AD, as well as an opportunity for caregivers to gain temporary relief from tasks associated with caregiving. Transportation and meals often are provided.  EXERCISE  Incorporating exercise into the daily routine has benefits for both the person with AD and the caregiver. Not only can it improve health, but it can also provide a meaningful activity for both of you to share.  Think about what kind of physical activities you both enjoy, perhaps walking, swimming, tennis, dancing, or gardening. Determine the time of day and place where this type of activity would work  best.  Be realistic in your expectations. Build slowly, perhaps just starting with a short walk around the yard, for example, before progressing to a walk around the block.  Be aware of any discomfort or signs of overexertion. Talk to the person's doctor if this happens.  Allow as much independence as possible, even if it means a less-than-perfect garden or a scoreless tennis match.  See what kinds of exercise programs are available in your area. Senior centers may have group programs for people who enjoy exercising with others. Local malls often have walking clubs and provide a place to exercise when the weather is bad.  Encourage physical activities. Spend time outside when the weather permits. Exercise often helps everyone sleep better.  INCONTINENCE  As the disease progresses, many people with AD begin to experience incontinence, or the inability to control their bladder and/or bowels. Incontinence can be upsetting to the person and difficult for the caregiver. Sometimes incontinence is due to physical illness, so be sure to discuss it with the person's doctor.  Have a routine for taking the person to the bathroom and stick to it as closely as possible. For example, take the person to the bathroom every 3 hours or so during the day. Do not wait for the person to ask.  Watch for signs that the person may need to go to the bathroom, such as restlessness or pulling at clothes. Respond quickly.  Be understanding when accidents occur. Stay calm and reassure the person if he or she is upset. Try to keep track of when accidents happen to help plan ways to avoid them.  To help prevent nighttime accidents, limit certain types of fluids, such as those with caffeine, in the evening.  If you are going to be out with the person, plan ahead. Know where restrooms are located, and have the person wear simple, easy-to-remove clothing. Take an extra set of clothing along, in case of an accident.  SLEEP PROBLEMS  For  the exhausted caregiver, sleep cannot come too soon. For many people with AD, however, nighttime may be a difficult time. Getting the person to go to bed and stay there may require some advance planning.  Set a quiet, peaceful tone in the evening to encourage sleep. Keep the lights dim, eliminate loud noises, even play soothing music if the person seems to enjoy it.  Try to keep bedtime at a similar time each evening. Developing a bedtime routine may help.  Encourage exercise during the day and limit daytime napping.  Restrict access to  caffeine late in the day.  Use night lights in the bedroom, hall, and bathroom if the darkness is frightening or disorienting.  HALLUCINATIONS AND DELUSIONS  As the disease progresses, a person with AD may experience hallucinations and/or delusions. Hallucinations are when the person sees, hears, smells, tastes, or feels something that is not there. Delusions are false beliefs from which the person cannot be dissuaded.  Sometimes hallucinations and delusions are a sign of a physical illness. Keep track of what the person is experiencing and discuss it with the doctor.  Avoid arguing with the person about what he or she sees or hears. Try to respond to the feelings he or she is expressing, and provide reassurance and comfort.  Try to distract the person to another topic or activity. Sometimes moving to another room or going outside for a walk may help.  Turn off the television when violent or disturbing programs are on. The person with AD may not be able to distinguish television programming from reality.  Make sure the person is safe and does not have access to anything he or she could use to harm anyone.  WANDERING  Keeping the person safe is one of the most important aspects of caregiving. Some people with AD have a tendency to wander away from their home or their caregiver. Knowing what to do to limit wandering can protect a person from becoming lost.  Make sure  that the person carries some kind of identification or wears a medical bracelet. If he or she gets lost and is unable to communicate adequately, this will alert others to his or her identity and medical condition.  Keep a recent photograph or videotape of the person with AD, to assist police if the person becomes lost.  Keep doors locked. Consider a keyed deadbolt or an additional lock, up high or down low, on the door. If the person can open a lock because it is familiar, a new latch or lock may help.  Be sure to secure or put away anything that could cause danger, both inside and outside the house.  HOME SAFETY  Caregivers of people with AD often need to look at their homes through new eyes to identify and correct safety risks. Creating a safe environment can prevent many stressful and dangerous situations.  Install secure locks on all outside windows and doors, especially if the person is prone to wandering.  Remove the locks on bathroom doors, to prevent the person from accidentally locking himself or herself in.  Use childproof latches on kitchen cabinets and any place where cleaning supplies or other chemicals are kept.  Label medications, and keep them locked up. Also, make sure knives, lighters, matches, and guns are secured and out of reach.  Keep the house free from clutter. Remove scatter rugs and anything else that might contribute to a fall.  Make sure lighting is good, both inside and outside.  Consider installing an automatic shut-off switch on the stove to prevent burns or fire.  DRIVING  Making the decision that a person with AD is no longer safe to drive is difficult, and it needs to be communicated carefully and sensitively. Even though the person may be upset by the loss of independence, safety must be the priority.  Look for clues that safe driving is no longer possible, including getting lost in familiar places, driving too fast or too slow, disregarding traffic signs, or getting  angry or confused.  Be sensitive to the person's feelings about  losing the ability to drive, but be firm in your request that he or she no longer do so. Be consistent. Do not allow the person to drive on "good days" but forbid it on "bad days."  Ask the doctor to help. The person may view the doctor as an "authority" and be willing to stop driving. The doctor can also contact the Department of Motor Vehicles and request that the person be reevaluated.  If necessary, take the car keys. If just having keys is important to the person, substitute a different set of keys.  If all else fails, disable the car or move it to a location where the person cannot see it or gain access to it.  VISITING THE DOCTOR  It is important that the person with AD receive regular medical care. Advance planning can help the trip to the caregiver's office go more smoothly.  Try to schedule the appointment for the person's best time of day. Also, ask the office staff what time of day the office is least crowded.  Let the office staff know in advance that this person is confused. If there is something they might be able to do to make the visit go more smoothly, ask.  Do not tell the person about the appointment until the day of the visit or even shortly before it is time to go. Be positive and matter-of-fact.  Bring along something for the person to eat and drink and any activity that he or she may enjoy.  Have a friend or another family member go with you on the trip, so that one of you can be with the person while the other speaks with the doctor.  COPING WITH HOLIDAYS  Holidays are bittersweet for many AD caregivers. The happy memories of the past contrast with the difficulties of the present, and extra demands on time and energy can seem overwhelming. Finding a balance between rest and activity can help.  Keep or adapt family traditions that are important to you. Include the person with AD as much as possible.  Recognize that  things will be different, and have realistic expectations about what you can do.  Encourage friends and family to visit. Limit the number of visitors at one time, and try to schedule visits during the time of day when the person is at his or her best.  Avoid crowds, changes in routine, and strange surroundings that may cause confusion or agitation.  Do your best to enjoy yourself. Try to find time for the holiday things you like to do, even if it means asking a friend or family member to spend time with the person while you are out.  VISITING A PERSON WITH AD  Visitors are important to people with AD. They may not always remember who the visitors are, but just the human connection has value. Here are some ideas to share with someone who is planning to visit a person with AD.  Plan the visit at the time of day when the person is at his or her best. Consider bringing along some kind of activity, such as something familiar to read or photo albums to look at, but be prepared to skip it, if necessary.  Be calm and quiet. Avoid using a loud tone of voice or talking to the person as if he or she were a child. Respect the person's personal space and do not get too close.  Try to establish eye contact and call the person by name to get  his or her attention. Remind the person who you are if he or she does not seem to recognize you.  If the person is confused, do not argue. Respond to the feelings you hear being communicated, and distract the person to a different topic, if necessary.  If the person does not recognize you, is unkind, or responds angrily, remember not to take it personally. He or she is reacting out of confusion.  CHOOSING A NURSING HOME  For many caregivers, there comes a point when they are no longer able to take care of their loved one at home. Choosing a residential care facility, like a nursing home or an assisted living facility, is a big decision, and it can be hard to know where to start.    It is helpful to gather information about services and options before the need actually arises. This gives you time to explore all the possibilities fully, before making a decision.  Determine what facilities are in your area. Caregivers, friends, relatives, hospital social workers, and religious organizations may be able to help you identify specific facilities.  Make a list of questions you would like to ask the staff. Think about what is important to you, such as activity programs, transportation, or special units for people with AD.  Contact the places that interest you and make an appointment to visit. Talk to the administration, nursing staff, and residents.  Observe the way the facility runs and how residents are treated. You may want to drop by again unannounced, to see if your impressions are the same.  Find out what kinds of programs and services are offered for people with AD and their families. Ask about staff training in dementia care, and check to see what the policy is about family participation in planning patient care.  Check on room availability, cost, method of payment, and participation in Medicare or Medicaid. You may want to place your name on a waiting list, even if you are not ready to make an immediate decision about long-term care.  Once you have made a decision, be sure you understand the terms of the contract and financial agreement. You may want to have a lawyer review the documents with you before signing.  Moving is a big change for both the person with AD and the caregiver. A social worker may be able to help you plan for, and adjust to, the move. It is important to have support during this difficult transition.  FOR MORE INFORMATION  Alzheimer's Association: LimitLaws.hu  Alzheimer's Disease Education and Referral (ADEAR) Center: [employer]CowGambling.be
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
## 2011-08-04 NOTE — ED Notes (Signed)
Lab called and it is confirmed that a blue top tube was sent to the lab.

## 2011-08-14 ENCOUNTER — Other Ambulatory Visit: Payer: Self-pay | Admitting: Family Medicine

## 2011-08-14 ENCOUNTER — Ambulatory Visit
Admission: RE | Admit: 2011-08-14 | Discharge: 2011-08-14 | Disposition: A | Discharge status: Home / Self Care | Payer: Medicare Other | Source: Ambulatory Visit | Attending: Family Medicine | Admitting: Family Medicine

## 2011-08-14 DIAGNOSIS — M25519 Pain in unspecified shoulder: Secondary | ICD-10-CM

## 2011-08-21 ENCOUNTER — Ambulatory Visit: Payer: Medicare Other | Admitting: Cardiology

## 2011-08-22 ENCOUNTER — Ambulatory Visit: Payer: Medicare Other | Admitting: Cardiology

## 2011-08-28 ENCOUNTER — Other Ambulatory Visit: Payer: Medicare Other

## 2011-09-26 ENCOUNTER — Ambulatory Visit (INDEPENDENT_AMBULATORY_CARE_PROVIDER_SITE_OTHER): Payer: Medicare Other | Admitting: Cardiology

## 2011-09-26 ENCOUNTER — Encounter: Payer: Self-pay | Admitting: Cardiology

## 2011-09-26 ENCOUNTER — Ambulatory Visit (INDEPENDENT_AMBULATORY_CARE_PROVIDER_SITE_OTHER): Payer: Medicare Other | Admitting: *Deleted

## 2011-09-26 VITALS — BP 130/66 | HR 62 | Resp 18 | Wt 153.0 lb

## 2011-09-26 DIAGNOSIS — I059 Rheumatic mitral valve disease, unspecified: Secondary | ICD-10-CM

## 2011-09-26 DIAGNOSIS — I5022 Chronic systolic (congestive) heart failure: Secondary | ICD-10-CM

## 2011-09-26 DIAGNOSIS — I340 Nonrheumatic mitral (valve) insufficiency: Secondary | ICD-10-CM | POA: Insufficient documentation

## 2011-09-26 DIAGNOSIS — Z7901 Long term (current) use of anticoagulants: Secondary | ICD-10-CM

## 2011-09-26 DIAGNOSIS — I4891 Unspecified atrial fibrillation: Secondary | ICD-10-CM

## 2011-09-26 NOTE — Assessment & Plan Note (Signed)
Chronic systolic heart failure. EF 35% with moderate to severe MR on most recent echo.  NYHA class II symptoms, improved.  She does not appear significantly volume overloaded on exam.  Cardiomyopathy may be tachycardia-mediated.  She is now in NSR. - I will get an echocardiogram to reassess her LV systolic function.  - Continue Toprol XL, torsemide at current dose, and valsartan.

## 2011-09-26 NOTE — Assessment & Plan Note (Signed)
In NSR today.  Continue coumadin. I talked at length to her daughter about making sure she uses a multi-pronged cane.  If falls become more frequent, she will need to stop coumadin.  Continue Toprol XL; she seems to be tolerating this well.

## 2011-09-26 NOTE — Assessment & Plan Note (Signed)
With age and dementia, not a candidate for any sort of surgical intervention.

## 2011-09-26 NOTE — Patient Instructions (Signed)
Your physician has requested that you have an echocardiogram. Echocardiography is a painless test that uses sound waves to create images of your heart. It provides your doctor with information about the size and shape of your heart and how well your heart's chambers and valves are working. This procedure takes approximately one hour. There are no restrictions for this procedure.   Your physician recommends that you schedule a follow-up appointment in: 3 months with Dr. Shirlee Latch  Your physician recommends that you continue on your current medications as directed. Please refer to the Current Medication list given to you today.
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
## 2011-10-02 NOTE — Progress Notes (Signed)
Addended by: Vista Mink D on: 10/02/2011 10:42 AM   Modules accepted: Orders

## 2011-10-16 ENCOUNTER — Ambulatory Visit (HOSPITAL_COMMUNITY): Payer: Medicare Other | Attending: Cardiovascular Disease

## 2011-10-16 ENCOUNTER — Ambulatory Visit (INDEPENDENT_AMBULATORY_CARE_PROVIDER_SITE_OTHER): Payer: Medicare Other | Admitting: *Deleted

## 2011-10-16 ENCOUNTER — Other Ambulatory Visit: Payer: Self-pay

## 2011-10-16 DIAGNOSIS — I379 Nonrheumatic pulmonary valve disorder, unspecified: Secondary | ICD-10-CM | POA: Insufficient documentation

## 2011-10-16 DIAGNOSIS — I4891 Unspecified atrial fibrillation: Secondary | ICD-10-CM

## 2011-10-16 DIAGNOSIS — E785 Hyperlipidemia, unspecified: Secondary | ICD-10-CM | POA: Insufficient documentation

## 2011-10-16 DIAGNOSIS — J4489 Other specified chronic obstructive pulmonary disease: Secondary | ICD-10-CM | POA: Insufficient documentation

## 2011-10-16 DIAGNOSIS — I10 Essential (primary) hypertension: Secondary | ICD-10-CM | POA: Insufficient documentation

## 2011-10-16 DIAGNOSIS — I359 Nonrheumatic aortic valve disorder, unspecified: Secondary | ICD-10-CM

## 2011-10-16 DIAGNOSIS — I5022 Chronic systolic (congestive) heart failure: Secondary | ICD-10-CM

## 2011-10-16 DIAGNOSIS — J449 Chronic obstructive pulmonary disease, unspecified: Secondary | ICD-10-CM | POA: Insufficient documentation

## 2011-10-16 DIAGNOSIS — I080 Rheumatic disorders of both mitral and aortic valves: Secondary | ICD-10-CM | POA: Insufficient documentation

## 2011-10-16 DIAGNOSIS — I079 Rheumatic tricuspid valve disease, unspecified: Secondary | ICD-10-CM | POA: Insufficient documentation

## 2011-10-16 DIAGNOSIS — Z7901 Long term (current) use of anticoagulants: Secondary | ICD-10-CM

## 2011-10-16 LAB — POCT INR: INR: 1.2

## 2011-10-23 ENCOUNTER — Ambulatory Visit (INDEPENDENT_AMBULATORY_CARE_PROVIDER_SITE_OTHER): Payer: Medicare Other | Admitting: *Deleted

## 2011-10-23 DIAGNOSIS — Z7901 Long term (current) use of anticoagulants: Secondary | ICD-10-CM

## 2011-10-23 DIAGNOSIS — I4891 Unspecified atrial fibrillation: Secondary | ICD-10-CM

## 2011-10-23 LAB — POCT INR: INR: 2.8

## 2011-11-01 ENCOUNTER — Other Ambulatory Visit: Payer: Self-pay | Admitting: Cardiology

## 2011-11-02 MED ORDER — TORSEMIDE 20 MG PO TABS: 60.0000 mg | ORAL_TABLET | Freq: Every morning | ORAL | Status: DC

## 2011-11-06 ENCOUNTER — Ambulatory Visit (INDEPENDENT_AMBULATORY_CARE_PROVIDER_SITE_OTHER): Payer: Medicare Other | Admitting: *Deleted

## 2011-11-06 DIAGNOSIS — I4891 Unspecified atrial fibrillation: Secondary | ICD-10-CM

## 2011-11-06 DIAGNOSIS — Z7901 Long term (current) use of anticoagulants: Secondary | ICD-10-CM

## 2011-11-27 ENCOUNTER — Ambulatory Visit (INDEPENDENT_AMBULATORY_CARE_PROVIDER_SITE_OTHER): Payer: Medicare Other | Admitting: *Deleted

## 2011-11-27 DIAGNOSIS — Z7901 Long term (current) use of anticoagulants: Secondary | ICD-10-CM

## 2011-11-27 DIAGNOSIS — I4891 Unspecified atrial fibrillation: Secondary | ICD-10-CM

## 2011-12-23 ENCOUNTER — Ambulatory Visit (INDEPENDENT_AMBULATORY_CARE_PROVIDER_SITE_OTHER): Payer: Medicare Other | Admitting: *Deleted

## 2011-12-23 DIAGNOSIS — Z7901 Long term (current) use of anticoagulants: Secondary | ICD-10-CM

## 2011-12-23 DIAGNOSIS — I4891 Unspecified atrial fibrillation: Secondary | ICD-10-CM

## 2012-01-01 ENCOUNTER — Ambulatory Visit: Payer: Medicare Other | Admitting: Cardiology

## 2012-01-13 ENCOUNTER — Ambulatory Visit (INDEPENDENT_AMBULATORY_CARE_PROVIDER_SITE_OTHER): Payer: Medicare Other | Admitting: Nurse Practitioner

## 2012-01-13 ENCOUNTER — Encounter: Payer: Self-pay | Admitting: Nurse Practitioner

## 2012-01-13 VITALS — BP 110/68 | HR 70 | Ht 66.0 in | Wt 158.8 lb

## 2012-01-13 DIAGNOSIS — R0602 Shortness of breath: Secondary | ICD-10-CM

## 2012-01-13 DIAGNOSIS — I509 Heart failure, unspecified: Secondary | ICD-10-CM

## 2012-01-13 DIAGNOSIS — I340 Nonrheumatic mitral (valve) insufficiency: Secondary | ICD-10-CM

## 2012-01-13 DIAGNOSIS — I5043 Acute on chronic combined systolic (congestive) and diastolic (congestive) heart failure: Secondary | ICD-10-CM

## 2012-01-13 DIAGNOSIS — I059 Rheumatic mitral valve disease, unspecified: Secondary | ICD-10-CM

## 2012-01-13 DIAGNOSIS — I4891 Unspecified atrial fibrillation: Secondary | ICD-10-CM

## 2012-01-13 LAB — BASIC METABOLIC PANEL
CO2: 37 mEq/L — ABNORMAL HIGH (ref 19–32)
Calcium: 9.5 mg/dL (ref 8.4–10.5)
Creatinine, Ser: 1.4 mg/dL — ABNORMAL HIGH (ref 0.4–1.2)
GFR: 37.12 mL/min — ABNORMAL LOW (ref >60.00)
Glucose, Bld: 85 mg/dL (ref 70–99)
Sodium: 141 mEq/L (ref 135–145)

## 2012-01-13 MED ORDER — POTASSIUM CHLORIDE CRYS ER 20 MEQ PO TBCR: 20.0000 meq | EXTENDED_RELEASE_TABLET | Freq: Two times a day (BID) | ORAL | Status: DC

## 2012-01-13 MED ORDER — TORSEMIDE 20 MG PO TABS: 30.0000 mg | ORAL_TABLET | Freq: Two times a day (BID) | ORAL | Status: DC

## 2012-01-13 NOTE — Patient Instructions (Addendum)
Your physician recommends that you keep your follow-up appointment as scheduled Your physician recommends that you have lab work drawn today (BMP and BNP) Your physician has recommended you make the following change in your medication: INCREASE for 3 days your Torsemide 2 tablets twice a day for 3 days and Potassium 20 mEq three times a day for 3 days then Torsemide 1 1/2 tab twice daily and Potassium 20 mEq bid
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
## 2012-01-15 ENCOUNTER — Telehealth: Payer: Self-pay | Admitting: *Deleted

## 2012-01-15 NOTE — Telephone Encounter (Signed)
lmom labs ok, however bnp slightly elevated and to stay on increased dose of torsemide x 3 days as instructed on 8/19, and to keep f/u with Dr. Shirlee Latch

## 2012-01-15 NOTE — Telephone Encounter (Signed)
Message copied by Tarri Fuller on Wed Jan 15, 2012  9:26 AM ------      Message from: Nicolasa Ducking R      Created: Mon Jan 13, 2012  5:50 PM       bnp not significantly elevated.  Labs otherwise look stable.  Continue with plan for increased torsemide x 3 days only.  F/u with Shirlee Latch as scheduled.

## 2012-01-30 ENCOUNTER — Encounter: Payer: Self-pay | Admitting: Cardiology

## 2012-01-30 ENCOUNTER — Ambulatory Visit (INDEPENDENT_AMBULATORY_CARE_PROVIDER_SITE_OTHER): Payer: Medicare Other | Admitting: Cardiology

## 2012-01-30 ENCOUNTER — Ambulatory Visit (INDEPENDENT_AMBULATORY_CARE_PROVIDER_SITE_OTHER): Payer: Medicare Other | Admitting: Pharmacist

## 2012-01-30 VITALS — BP 100/50 | HR 61 | Ht 66.0 in | Wt 155.0 lb

## 2012-01-30 DIAGNOSIS — I5022 Chronic systolic (congestive) heart failure: Secondary | ICD-10-CM

## 2012-01-30 DIAGNOSIS — Z7901 Long term (current) use of anticoagulants: Secondary | ICD-10-CM

## 2012-01-30 DIAGNOSIS — I059 Rheumatic mitral valve disease, unspecified: Secondary | ICD-10-CM

## 2012-01-30 DIAGNOSIS — R0602 Shortness of breath: Secondary | ICD-10-CM

## 2012-01-30 DIAGNOSIS — I4891 Unspecified atrial fibrillation: Secondary | ICD-10-CM

## 2012-01-30 DIAGNOSIS — I340 Nonrheumatic mitral (valve) insufficiency: Secondary | ICD-10-CM

## 2012-01-30 LAB — BASIC METABOLIC PANEL
CO2: 35 mEq/L — ABNORMAL HIGH (ref 19–32)
Calcium: 9.6 mg/dL (ref 8.4–10.5)
Creatinine, Ser: 1.4 mg/dL — ABNORMAL HIGH (ref 0.4–1.2)
GFR: 39 mL/min — ABNORMAL LOW (ref >60.00)
Glucose, Bld: 90 mg/dL (ref 70–99)
Sodium: 139 mEq/L (ref 135–145)

## 2012-01-30 LAB — LIPID PANEL
LDL Cholesterol: 56 mg/dL (ref 0–99)
Total CHOL/HDL Ratio: 2
Triglycerides: 67 mg/dL (ref 0.0–149.0)

## 2012-01-30 LAB — POCT INR: INR: 2.6

## 2012-01-30 NOTE — Patient Instructions (Addendum)
Your physician recommends that you have  for lab work today--BMET/BNP.  If you gain more than 2 pounds over 158 pounds in 24 hours or more than 3 pounds over 158 pounds in 1 week you should  increase your torsemide to 40mg  two times a day for 3 days.   Your physician recommends that you schedule a follow-up appointment in: 2 months with Dr Shirlee Latch.

## 2012-01-31 NOTE — Assessment & Plan Note (Signed)
EF improved to 45-50% on last echo.  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  LV function is better now that she has been back in NSR for a while.  Continue current doses of Toprol XL and valsartan.  I will continue her at 30 mg bid of torsemide.  If she gains 2 lbs overnight or 3 lbs in a week, she can take torsemide 40 mg bid x 3 days then go back to 30 mg bid.  I will get a BMET/BNP today.

## 2012-01-31 NOTE — Assessment & Plan Note (Signed)
Seems to have improved, only mild-moderate on most recent echo while in NSR.  Atrial fibrillation can certainly lead to worsening MR so improvement is not unexpected with resumption of NSR.

## 2012-01-31 NOTE — Progress Notes (Signed)
Patient ID: Brianna Robertson, female   DOB: [birth year], 78 y.o.   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  She has a history of dementia, atrial fibrillation, and possible tachycardia-mediated cardiomyopathy.  She went back into atrial fibrillation with rapid ventricular response in 12/11 and was hospitalized in [place]. Myoview showed no ischemia. Echo showed EF 20% with global hypokinesis. She was diuresed and rate was controlled. She was started on coumadin. No bleeding complications since starting coumadin. Repeat echo in 2/12 with rate control showed some improvement in EF, 35-40%. There was moderate to severe mitral regurgitation and mild aortic stenosis.   Most recent echo in 5/13 showed EF 45-50% with mild AS and mild to moderate MR.  She has been in NSR.   Since I last saw her, Mrs Diefendorf was seen by the PA in our office with weight gain and increased dyspnea.  He increased her torsemide for several days, and she lost weight.  Symptoms went back to baseline.  Since last appointment, weight is down 3 lbs.  She is able to walk about 30 yards to the mailbox with no problems.  She is short of breath if she walks up a hill.  No chest pain.  No orthopnea or PND.    Labs (2/13): K 4.6, creatinine 1.3, BNP 501 => 325 Labs (3/13): K 4, creatinine 1.3 Labs (8/13): K 4.1, creatinine 1.4, BNP 217  Past Medical History:  1. Breast cancer, hx of 1995. S/P lumpectomy and radiation (node, ER, PR negative).  2. Hypertension  3. Hypothyroidism  4. 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 Patient was cardioverted and maintained on amiodarone, and EF improved to 40%. Patient stopped amiodarone because of anorexia and stopped coumadin due to rectus sheath hematoma. Atrial fib with RVR recurred in 12/11. EF down to 20% on echo. Patient was rate-controlled and coumadin was restarted.   Recently, she has been back in NSR.  5. Moderate dementia  6. CKD: mild  7. Cardiomyopathy: Possibly tachycardia-mediated. In 4/07 in setting of atrial fibrillation with RVR, EF was 30%. After conversion to NSR, EF 40% in 8/09. In 12/11, noted to be back in atrial fibrillation with RVR. Echo (12/11) with EF 20%, severe global hypokinesis, mild to moderate MR, PA systolic pressure 39 mmHg, mild to moderate AS with mean gradient of 13 (may be low gradient AS in the setting of decreased cardiac output). Myoview in 4/07 was negative for ischemia or infarction. Myoview (12/11) showed small fixed apical defect likely due to apical thinning. No ischemia. Echo (2/12): EF 35% with diffuse hypokinesis (worse anteroseptally), mild AS (mean gradient 16 mmHg), mild AI, moderate to severe MR, moderate biatrial enlargement, normal RV, PA systolic pressure 40 mmHg.  Echo (5/13): EF 45-50%, mild AS, mild to moderate MR, PA systolic pressure 52 mmHg.  7. Hyperlipidemia  8. Pulmonary Nodule 9. LBBB  10. Aortic stenosis: mild.  11. MItral regurgitation: moderate to severe.  12. Asthma 13. Carotid dopplers (9/12): 0-39% bilateral ICA stenosis.    Current Outpatient Prescriptions  Medication Sig Dispense Refill  . calcium carbonate (OS-CAL) 600 MG TABS Take 600 mg by mouth 2 (two) times daily with a meal.        . citalopram (CELEXA) 20 MG tablet Take 20 mg by mouth daily.        . Cyanocobalamin (B-12) 2000 MCG TABS Take 1 tablet by mouth daily.      Marland Kitchen  donepezil (ARICEPT) 10 MG tablet Take 10 mg by mouth at bedtime as needed.        Marland Kitchen letrozole (FEMARA) 2.5 MG tablet Take 1 tablet by mouth Daily.      Marland Kitchen levothyroxine (SYNTHROID, LEVOTHROID) 25 MCG tablet Take 25 mcg by mouth daily.        . memantine (NAMENDA TITRATION PACK) tablet pack Take 5 mg by mouth daily.       . metoprolol succinate (TOPROL-XL) 50 MG 24 hr tablet Take 1 tablet (50 mg total) by mouth daily.  30 tablet  11  . potassium chloride SA (KLOR-CON  M20) 20 MEQ tablet Take 1 tablet (20 mEq total) by mouth 2 (two) times daily.  180 tablet  2  . simvastatin (ZOCOR) 20 MG tablet Take 20 mg by mouth at bedtime.        Marland Kitchen tiotropium (SPIRIVA) 18 MCG inhalation capsule Place 18 mcg into inhaler and inhale daily.        Marland Kitchen torsemide (DEMADEX) 20 MG tablet Take 1.5 tablets (30 mg total) by mouth 2 (two) times daily.  270 tablet  2  . valsartan (DIOVAN) 80 MG tablet Take 40 mg by mouth daily.       Marland Kitchen warfarin (COUMADIN) 2.5 MG tablet Take 1 tablet (2.5 mg total) by mouth as directed. Take as directed by Coumadin clinic  70 tablet  3    Allergies: Allergies  Allergen Reactions  . Lasix (Furosemide)     Discontinued by Dr Duaine Dredge  . Penicillins   . Sulfonamide Derivatives     History  Substance Use Topics  . Smoking status: Never Smoker   . Smokeless tobacco: Never Used  . Alcohol Use: No     very infrequently    Family History:  Family hx of heart disease,cancer, and arthritis.  Brother- pancreatic cancer  PHYSICAL EXAM: VS:  BP 100/50  Pulse 61  Ht 5\' 6"  (1.676 m)  Wt 155 lb (70.308 kg)  BMI 25.02 kg/m2  SpO2 94%  Well nourished, well developed, in no acute distress HEENT: normal Neck: no JVD Cardiac:  S1, S2, regular; 2/6 SEM RUSB.  Lungs:  Slight crackles at the bases bilaterally.  Abd: soft, nontender  Ext: Trace ankle edema.  Skin: warm and dry Neuro: Alert, oriented, memory is deficient Psych: pleasant

## 2012-01-31 NOTE — Assessment & Plan Note (Signed)
In NSR today.  Continue coumadin.  No recent falls.  Continue Toprol XL; she seems to be tolerating this well.

## 2012-02-05 ENCOUNTER — Telehealth: Payer: Self-pay | Admitting: Cardiology

## 2012-02-05 NOTE — Telephone Encounter (Signed)
It is unlikely to be causing such a significant degree of memory impairment.  There is a very small chance that it could play a role.  She can try stopping it for 3-4 weeks but should restart if if not improvement.

## 2012-02-05 NOTE — Telephone Encounter (Signed)
[phone] pt rtn call to [facility]

## 2012-02-05 NOTE — Telephone Encounter (Signed)
Spoke with pt's daughter, Misty Stanley about pt's recent lipid results. She is asking if Dr Shirlee Latch thinks simvastatin may be contributing to pt's memory problems. I will forward to Dr Shirlee Latch for review and recommendations.

## 2012-02-05 NOTE — Telephone Encounter (Signed)
LM with Dr Alford Highland recommendations on Voice ID voice mail  for [place].

## 2012-02-07 ENCOUNTER — Other Ambulatory Visit: Payer: Self-pay | Admitting: Thoracic Surgery (Cardiothoracic Vascular Surgery)

## 2012-02-07 DIAGNOSIS — I712 Thoracic aortic aneurysm, without rupture: Secondary | ICD-10-CM

## 2012-02-20 NOTE — Progress Notes (Signed)
This encounter was created in error - please disregard.

## 2012-03-05 ENCOUNTER — Ambulatory Visit (INDEPENDENT_AMBULATORY_CARE_PROVIDER_SITE_OTHER): Payer: Medicare Other | Admitting: *Deleted

## 2012-03-05 DIAGNOSIS — I4891 Unspecified atrial fibrillation: Secondary | ICD-10-CM

## 2012-03-05 DIAGNOSIS — Z7901 Long term (current) use of anticoagulants: Secondary | ICD-10-CM

## 2012-03-17 ENCOUNTER — Other Ambulatory Visit: Payer: Medicare Other

## 2012-03-17 ENCOUNTER — Ambulatory Visit: Payer: Medicare Other | Admitting: Thoracic Surgery (Cardiothoracic Vascular Surgery)

## 2012-03-19 ENCOUNTER — Other Ambulatory Visit: Payer: Self-pay | Admitting: Family Medicine

## 2012-03-19 ENCOUNTER — Ambulatory Visit
Admission: RE | Admit: 2012-03-19 | Discharge: 2012-03-19 | Disposition: A | Discharge status: Home / Self Care | Payer: Medicare Other | Source: Ambulatory Visit | Attending: Family Medicine | Admitting: Family Medicine

## 2012-03-19 DIAGNOSIS — I509 Heart failure, unspecified: Secondary | ICD-10-CM

## 2012-03-19 DIAGNOSIS — R0600 Dyspnea, unspecified: Secondary | ICD-10-CM

## 2012-03-25 ENCOUNTER — Other Ambulatory Visit: Payer: Self-pay | Admitting: Cardiology

## 2012-03-25 DIAGNOSIS — I6529 Occlusion and stenosis of unspecified carotid artery: Secondary | ICD-10-CM

## 2012-03-30 ENCOUNTER — Encounter (INDEPENDENT_AMBULATORY_CARE_PROVIDER_SITE_OTHER): Payer: Medicare Other

## 2012-03-30 ENCOUNTER — Ambulatory Visit (INDEPENDENT_AMBULATORY_CARE_PROVIDER_SITE_OTHER): Payer: Medicare Other | Admitting: Cardiology

## 2012-03-30 ENCOUNTER — Ambulatory Visit (INDEPENDENT_AMBULATORY_CARE_PROVIDER_SITE_OTHER): Payer: Medicare Other

## 2012-03-30 ENCOUNTER — Encounter: Payer: Self-pay | Admitting: Cardiology

## 2012-03-30 ENCOUNTER — Telehealth: Payer: Self-pay | Admitting: Cardiology

## 2012-03-30 ENCOUNTER — Other Ambulatory Visit: Payer: Self-pay | Admitting: *Deleted

## 2012-03-30 VITALS — BP 88/50 | HR 59 | Ht 66.0 in | Wt 158.0 lb

## 2012-03-30 DIAGNOSIS — R0602 Shortness of breath: Secondary | ICD-10-CM

## 2012-03-30 DIAGNOSIS — I4891 Unspecified atrial fibrillation: Secondary | ICD-10-CM

## 2012-03-30 DIAGNOSIS — I6529 Occlusion and stenosis of unspecified carotid artery: Secondary | ICD-10-CM

## 2012-03-30 DIAGNOSIS — I5022 Chronic systolic (congestive) heart failure: Secondary | ICD-10-CM

## 2012-03-30 DIAGNOSIS — Z7901 Long term (current) use of anticoagulants: Secondary | ICD-10-CM

## 2012-03-30 LAB — BASIC METABOLIC PANEL
BUN: 23 mg/dL (ref 6–23)
CO2: 38 mEq/L — ABNORMAL HIGH (ref 19–32)
Chloride: 95 mEq/L — ABNORMAL LOW (ref 96–112)
Creatinine, Ser: 1.5 mg/dL — ABNORMAL HIGH (ref 0.4–1.2)
Potassium: 4.1 mEq/L (ref 3.5–5.1)

## 2012-03-30 LAB — POCT INR: INR: 2.9

## 2012-03-30 NOTE — Telephone Encounter (Signed)
Spoke with pt's daughter,Lisa. Recent BP readings: 03/29/12 117/57 69   03/28/12  122/59 65   03/27/12 121/58 68 03/26/12 121/55 68   03/25/12  90/60 61   03/24/12 126/65 85    03/23/12 103/58 73   03/22/12  86/53 78   03/21/12 115/57 68   03/20/12  122/71 76   03/19/12 120/58 58 . I will forward to Dr Shirlee Latch for review.

## 2012-03-30 NOTE — Telephone Encounter (Signed)
Most readings are ok

## 2012-03-30 NOTE — Patient Instructions (Addendum)
Your physician recommends that you have  lab work today--BMET/BNP.  Call with your blood pressure readings. Katina Dung [phone].  Your physician recommends that you schedule a follow-up appointment in: 3 months with Dr Shirlee Latch.

## 2012-03-30 NOTE — Telephone Encounter (Signed)
New Problem:    Called in wanting to give you the patient's BP readings for the last 11 days.  Please call back.

## 2012-03-30 NOTE — Addendum Note (Signed)
Addended by: Tonita Phoenix on: 03/30/2012 01:16 PM   Modules accepted: Orders

## 2012-03-31 ENCOUNTER — Other Ambulatory Visit: Payer: Medicare Other

## 2012-03-31 ENCOUNTER — Ambulatory Visit: Payer: Medicare Other | Admitting: Thoracic Surgery (Cardiothoracic Vascular Surgery)

## 2012-03-31 ENCOUNTER — Encounter: Payer: Medicare Other | Admitting: Thoracic Surgery (Cardiothoracic Vascular Surgery)

## 2012-03-31 NOTE — Progress Notes (Signed)
Patient ID: Brianna Robertson, female   DOB: 1925/11/12, 78 y.o.   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  She has a history of dementia, atrial fibrillation, and possible tachycardia-mediated cardiomyopathy.  She went back into atrial fibrillation with rapid ventricular response in 12/11 and was hospitalized in [place]. Myoview showed no ischemia. Echo showed EF 20% with global hypokinesis. She was diuresed and rate was controlled. She was started on coumadin. No bleeding complications since starting coumadin. Repeat echo in 2/12 with rate control showed some improvement in EF, 35-40%. There was moderate to severe mitral regurgitation and mild aortic stenosis.   Most recent echo in 5/13 showed EF 45-50% with mild AS and mild to moderate MR.  She has been in NSR.   Mrs Poot has had stable exertional dyspnea for the last 6+ months.  She is short of breath after walking 50-100 feet and probably is orthopneic (sleeps with her head propped up).  No PND, no chest pain.  She has been taking torsemide 50 mg qam, 30 mg qpm.   BP was low in the office today but her readings at home for the most part have been > 100 systolic.  No falls, no lightheadedness.  Patient is in NSR today.   ECG: NSR, LBBB  Labs (2/13): K 4.6, creatinine 1.3, BNP 501 => 325 Labs (3/13): K 4, creatinine 1.3 Labs (8/13): K 4.1, creatinine 1.4, BNP 217 Labs (9/13): KL 4.1, creatinine 1.4, BNP 217, LDL 56, HDl 64  Past Medical History:  1. Breast cancer, hx of 1995. S/P lumpectomy and radiation (node, ER, PR negative).  2. Hypertension  3. Hypothyroidism  4. 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 Patient was cardioverted and maintained on amiodarone, and EF improved to 40%. Patient stopped amiodarone because of anorexia and stopped coumadin due to rectus sheath hematoma. Atrial fib with RVR recurred in  12/11. EF down to 20% on echo. Patient was rate-controlled and coumadin was restarted.  Recently, she has been back in NSR.  5. Moderate dementia  6. CKD: mild  7. Cardiomyopathy: Possibly tachycardia-mediated. In 4/07 in setting of atrial fibrillation with RVR, EF was 30%. After conversion to NSR, EF 40% in 8/09. In 12/11, noted to be back in atrial fibrillation with RVR. Echo (12/11) with EF 20%, severe global hypokinesis, mild to moderate MR, PA systolic pressure 39 mmHg, mild to moderate AS with mean gradient of 13 (may be low gradient AS in the setting of decreased cardiac output). Myoview in 4/07 was negative for ischemia or infarction. Myoview (12/11) showed small fixed apical defect likely due to apical thinning. No ischemia. Echo (2/12): EF 35% with diffuse hypokinesis (worse anteroseptally), mild AS (mean gradient 16 mmHg), mild AI, moderate to severe MR, moderate biatrial enlargement, normal RV, PA systolic pressure 40 mmHg.  Echo (5/13): EF 45-50%, mild AS, mild to moderate MR, PA systolic pressure 52 mmHg.  7. Hyperlipidemia  8. Pulmonary Nodule 9. LBBB  10. Aortic stenosis: mild.  11. MItral regurgitation: moderate to severe.  12. Asthma 13. Carotid dopplers (9/12): 0-39% bilateral ICA stenosis.    Current Outpatient Prescriptions  Medication Sig Dispense Refill  . calcium carbonate (OS-CAL) 600 MG TABS Take 600 mg by mouth 2 (two) times daily with a meal.        . citalopram (CELEXA) 20 MG tablet Take 20 mg by mouth daily.        Marland Kitchen  Cyanocobalamin (B-12) 2000 MCG TABS Take 1 tablet by mouth daily.      Marland Kitchen donepezil (ARICEPT) 10 MG tablet Take 10 mg by mouth at bedtime as needed.        . ferrous sulfate 325 (65 FE) MG tablet Take 325 mg by mouth daily.      Marland Kitchen letrozole (FEMARA) 2.5 MG tablet Take 1 tablet by mouth Daily.      Marland Kitchen levothyroxine (SYNTHROID, LEVOTHROID) 25 MCG tablet Take 25 mcg by mouth daily.        . memantine (NAMENDA TITRATION PACK) tablet pack Take 5 mg by mouth  daily.       . metoprolol succinate (TOPROL-XL) 50 MG 24 hr tablet Take 1 tablet (50 mg total) by mouth daily.  30 tablet  11  . potassium chloride SA (KLOR-CON M20) 20 MEQ tablet Take 1 tablet (20 mEq total) by mouth 2 (two) times daily.  180 tablet  2  . simvastatin (ZOCOR) 20 MG tablet Take 20 mg by mouth at bedtime.        Marland Kitchen tiotropium (SPIRIVA) 18 MCG inhalation capsule Place 18 mcg into inhaler and inhale daily.        Marland Kitchen torsemide (DEMADEX) 20 MG tablet Take 20 mg by mouth. Taking 2.5 tablets in the am and 1.5 tablets in the pm      . valsartan (DIOVAN) 80 MG tablet Take 40 mg by mouth daily.       Marland Kitchen warfarin (COUMADIN) 2.5 MG tablet Take 1 tablet (2.5 mg total) by mouth as directed. Take as directed by Coumadin clinic  70 tablet  3    Allergies: Allergies  Allergen Reactions  . Lasix (Furosemide)     Discontinued by Dr Duaine Dredge  . Penicillins   . Sulfonamide Derivatives     History  Substance Use Topics  . Smoking status: Never Smoker   . Smokeless tobacco: Never Used  . Alcohol Use: No     Comment: very infrequently    Family History:  Family hx of heart disease,cancer, and arthritis.  Brother- pancreatic cancer  PHYSICAL EXAM: VS:  BP 88/50  Pulse 59  Ht 5\' 6"  (1.676 m)  Wt 158 lb (71.668 kg)  BMI 25.50 kg/m2  Well nourished, well developed, in no acute distress HEENT: normal Neck: no JVD Cardiac:  S1, S2, regular; 2/6 systolic murmur RUSB/apex.  Lungs:  Slight crackles at the bases bilaterally.  Abd: soft, nontender  Ext: 1+ ankle edema bilaterally.  Skin: warm and dry Neuro: Alert, oriented, memory is deficient Psych: pleasant   Assessment/Plan:  ATRIAL FIBRILLATION  In NSR today. Continue coumadin. No recent falls. Continue Toprol XL; she seems to be tolerating this well.  Chronic systolic heart failure   EF improved to 45-50% on last echo. 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 LV function is better now that she has been back  in NSR. Continue current doses of Toprol XL and valsartan. She continues to have NYHA class III symptoms but does not appear markedly volume overloaded.  She can continue her current torsemide dosing.   She will get BMET/BNP today.   Mitral regurgitation Seems to have improved, only mild-moderate on most recent echo while in NSR. Atrial fibrillation can certainly lead to worsening MR so improvement is not unexpected with resumption of NSR.   Marca Ancona 03/31/2012

## 2012-03-31 NOTE — Telephone Encounter (Signed)
LM on voice ID voice mail for Brianna Robertson that most readings are OK.

## 2012-04-14 ENCOUNTER — Encounter: Payer: Self-pay | Admitting: Thoracic Surgery (Cardiothoracic Vascular Surgery)

## 2012-04-14 ENCOUNTER — Ambulatory Visit
Admission: RE | Admit: 2012-04-14 | Discharge: 2012-04-14 | Disposition: A | Discharge status: Home / Self Care | Payer: Medicare Other | Source: Ambulatory Visit | Attending: Thoracic Surgery (Cardiothoracic Vascular Surgery) | Admitting: Thoracic Surgery (Cardiothoracic Vascular Surgery)

## 2012-04-14 ENCOUNTER — Ambulatory Visit (INDEPENDENT_AMBULATORY_CARE_PROVIDER_SITE_OTHER): Payer: Medicare Other | Admitting: Thoracic Surgery (Cardiothoracic Vascular Surgery)

## 2012-04-14 ENCOUNTER — Other Ambulatory Visit: Payer: Medicare Other

## 2012-04-14 VITALS — BP 107/62 | HR 63 | Resp 20 | Ht 61.0 in | Wt 150.0 lb

## 2012-04-14 DIAGNOSIS — I712 Thoracic aortic aneurysm, without rupture: Secondary | ICD-10-CM

## 2012-04-14 MED ORDER — IOHEXOL 350 MG/ML SOLN
80.0000 mL | Freq: Once | INTRAVENOUS | Status: AC | PRN
  Administered 2012-04-14: 80 mL via INTRAVENOUS

## 2012-04-14 NOTE — Progress Notes (Signed)
HPI:  Brianna Robertson returns today for one year followup. She is an 78 year old woman with multiple medical problems including dementia who has a known 4.3 cm ascending aortic aneurysm as well as multiple lung nodules. Dr. Maple Hudson is been following her lung nodules for many years and they've been stable to relatively slow growing. She was found on CT scan to have a 4.3 cm ascending aneurysm. I saw her for that a year ago.  In the interim since her last visit her daughter says that she seems to get more short of breath and it seems to occur with less exertion. She's been treated for that with fluid pills and that has helped a little. She did have an echo in May which showed mild AS and mild to moderate MR. Her daughter says that her dementia has gotten worse.  Past Medical History  Diagnosis Date  . Breast cancer 1995    bilateral  . HTN (hypertension)   . Hypothyroidism   . Atrial fibrillation 08/2005  . Dementia     moderate  . CKD (chronic kidney disease)     mild  . NICM (nonischemic cardiomyopathy)     a. possibly tachycardia-mediated;  b. 09/2011 Echo: EF 45-50%  . Hyperlipemia   . Pulmonary nodule 06/2006  . LBBB (left bundle branch block)   . Aortic stenosis     a. 09/2011 Echo: Mild AS, severely Ca2+ leaflets.  . Mitral regurgitation     a. 09/2011 Echo:  Mild-moderate (previously read as mod-sev).  . COPD (chronic obstructive pulmonary disease)   . Thoracic aortic aneurysm   . Depression   . Chronic systolic CHF (congestive heart failure)     a. 09/2011 Echo: EF 45-50%  . Scoliosis   . History of pneumonia 2010  . Insomnia   . Rotator cuff disorder     right  . Hearing loss   . Vitamin D deficiency   . History of transfusion of whole blood      Current Outpatient Prescriptions  Medication Sig Dispense Refill  . calcium carbonate (OS-CAL) 600 MG TABS Take 600 mg by mouth 2 (two) times daily with a meal.        . citalopram (CELEXA) 20 MG tablet Take 20 mg by mouth daily.         . Cyanocobalamin (B-12) 2000 MCG TABS Take 1 tablet by mouth daily.      Marland Kitchen donepezil (ARICEPT) 10 MG tablet Take 10 mg by mouth at bedtime as needed.        . ferrous sulfate 325 (65 FE) MG tablet Take 325 mg by mouth daily.      Marland Kitchen letrozole (FEMARA) 2.5 MG tablet Take 1 tablet by mouth Daily.      Marland Kitchen levothyroxine (SYNTHROID, LEVOTHROID) 25 MCG tablet Take 25 mcg by mouth daily.        . memantine (NAMENDA TITRATION PACK) tablet pack Take 5 mg by mouth daily.       . metoprolol succinate (TOPROL-XL) 50 MG 24 hr tablet Take 1 tablet (50 mg total) by mouth daily.  30 tablet  11  . potassium chloride SA (KLOR-CON M20) 20 MEQ tablet Take 1 tablet (20 mEq total) by mouth 2 (two) times daily.  180 tablet  2  . simvastatin (ZOCOR) 20 MG tablet Take 20 mg by mouth at bedtime.        Marland Kitchen tiotropium (SPIRIVA) 18 MCG inhalation capsule Place 18 mcg into inhaler and inhale daily.        Marland Kitchen  torsemide (DEMADEX) 20 MG tablet Take 20 mg by mouth. Taking 2.5 tablets in the am and 1.5 tablets in the pm      . valsartan (DIOVAN) 80 MG tablet Take 40 mg by mouth daily.       Marland Kitchen warfarin (COUMADIN) 2.5 MG tablet Take 1 tablet (2.5 mg total) by mouth as directed. Take as directed by Coumadin clinic  70 tablet  3   No current facility-administered medications for this visit.   Facility-Administered Medications Ordered in Other Visits  Medication Dose Route Frequency Provider Last Rate Last Dose  . [COMPLETED] iohexol (OMNIPAQUE) 350 MG/ML injection 80 mL  80 mL Intravenous Once PRN Medication Radiologist, MD   80 mL at 04/14/12 1510    Physical Exam BP 107/62  Pulse 63  Resp 20  Ht 5\' 1"  (1.549 m)  Wt 150 lb (68.04 kg)  BMI 28.34 kg/m2  SpO17 41% 78 year old woman in no acute distress Neurologic alert. No focal motor deficits Lungs diminished breath sounds bilaterally, no rales or wheezes Cardiac regular rate and rhythm with a 2/6 systolic murmur 2+ pitting edema lower chin these  Diagnostic Tests: CT  of chest 04/14/2012 *RADIOLOGY REPORT*  Clinical Data: Follow up ascending thoracic aortic aneurysm.  History of colon cancer.  CT ANGIOGRAPHY CHEST  Technique: Multidetector CT imaging of the chest using the  standard protocol during bolus administration of intravenous  contrast. Multiplanar reconstructed images including MIPs were  obtained and reviewed to evaluate the vascular anatomy.  Contrast: 80mL OMNIPAQUE IOHEXOL 350 MG/ML SOLN  Comparison: Chest CT 03/26/2011, 11/03/2009 and 07/10/2007 and  08/27/2005  Findings: Arterial structures: Again noted is fusiform dilatation  of the ascending thoracic aorta. The AP dimension of the ascending  thoracic aorta on sequence #4, image number 46, measures 4.4 cm.  This has not significantly changed from the previous examination.  There is no evidence for a dissection. The left vertebral artery  originates from the aortic arch which is a normal variant. The  proximal descending thoracic aorta measures up to 2.8 cm and  unchanged. Main pulmonary artery measures up to 3.1 cm and  minimally changed. There are calcifications involving the coronary  arteries.  The upper abdominal aorta is patent without aneurysmal dilatation.  Incidentally, there a large replaced hepatic artery coming off the  SMA.  No significant pericardial or pleural fluid. There is a  peripherally dense or enhancing nodule the right thyroid lobe that  measures up to 1.3 cm. Surgical clips just anterior to the upper  trachea may be secondary to a left hemi thyroidectomy. There are  small lymph nodes in the chest but no suspicious mediastinal or  hilar lymphadenopathy.  There is a stable air filled structure along the medial right upper  chest. Trachea and mainstem bronchi are patent. There is a small  nodular structure at the right lung base on sequence #5, image 48  which appears unchanged. Stable nodular density at the base of the  right middle lobe may represent pleural  based scarring. Stable 4  mm nodule in the right lung base on image 38. Stable 4 mm nodule  in the left lower lobe on image 25. There are additional stable  small nodules in the left lower lobe. Again noted is a large  nodule in the lingula adjacent to the left ventricle. This nodule  measures 1.6 x 1.5 cm and previously measured 1.7 x 1.4 cm.  However, this lesion did measure 1.1 x 1.4 cm in 2007.  Small  calcifications or high dense material along the posterior aspect of  this lesion. Stable scarring in the anterior left apex.  Again noted is a fluid attenuating structure along the left side of  T9-T10 which could represent a pseudomeningocele or benign  neurogenic cyst. There is scoliosis of the spine.  IMPRESSION:  Minimal change in the size of the ascending thoracic aorta.  Ascending thoracic measures up to 4.4 cm.  Minimal change in the bilateral pulmonary nodules. There is a  dominant pulmonary nodule in the lingula. This nodule remains  indeterminate. This nodule has been slowly growing since 2007 and  review of the PET-CT from 2007 demonstrated that the lesion was  hypermetabolic at that time.  1.5 cm right thyroid nodule.  Coronary artery calcifications.  Impression: 78 year old woman with a stable 4.4 cm ascending aortic aneurysm. No indication for surgery at this time. I discussed with daughter that she would not be a candidate for this type of surgery in any event.  She also has multiple lung nodules the largest being in the lingular segment of the left upper lobe. This is about 1.5 or 1.6 cm in diameter. These nodules been present for many years and it is very slowly growing. She is really not a candidate for surgical resection of those given her advanced age, dementia and comorbidities. Her daughter understands this as well   Plan: Return in one year with CT of chest to assess lung nodules and ascending aorta

## 2012-04-22 NOTE — Addendum Note (Signed)
Addended by: Kem Parkinson on: 04/22/2012 03:08 PM   Modules accepted: Orders

## 2012-04-30 ENCOUNTER — Ambulatory Visit (INDEPENDENT_AMBULATORY_CARE_PROVIDER_SITE_OTHER): Payer: Medicare Other

## 2012-04-30 DIAGNOSIS — Z7901 Long term (current) use of anticoagulants: Secondary | ICD-10-CM

## 2012-04-30 DIAGNOSIS — I4891 Unspecified atrial fibrillation: Secondary | ICD-10-CM

## 2012-05-14 ENCOUNTER — Ambulatory Visit (INDEPENDENT_AMBULATORY_CARE_PROVIDER_SITE_OTHER): Payer: Medicare Other | Admitting: Pharmacist

## 2012-05-14 DIAGNOSIS — Z7901 Long term (current) use of anticoagulants: Secondary | ICD-10-CM

## 2012-05-14 DIAGNOSIS — I4891 Unspecified atrial fibrillation: Secondary | ICD-10-CM

## 2012-05-14 LAB — POCT INR: INR: 3.6

## 2012-06-01 ENCOUNTER — Ambulatory Visit (INDEPENDENT_AMBULATORY_CARE_PROVIDER_SITE_OTHER): Payer: Medicare Other

## 2012-06-01 DIAGNOSIS — I4891 Unspecified atrial fibrillation: Secondary | ICD-10-CM

## 2012-06-01 DIAGNOSIS — Z7901 Long term (current) use of anticoagulants: Secondary | ICD-10-CM

## 2012-06-22 ENCOUNTER — Ambulatory Visit (INDEPENDENT_AMBULATORY_CARE_PROVIDER_SITE_OTHER): Payer: Medicare Other

## 2012-06-22 DIAGNOSIS — I4891 Unspecified atrial fibrillation: Secondary | ICD-10-CM

## 2012-06-22 DIAGNOSIS — Z7901 Long term (current) use of anticoagulants: Secondary | ICD-10-CM

## 2012-06-22 LAB — POCT INR: INR: 2.4

## 2012-07-09 ENCOUNTER — Ambulatory Visit: Payer: Medicare Other | Admitting: Cardiology

## 2012-07-13 ENCOUNTER — Other Ambulatory Visit: Payer: Self-pay | Admitting: Physician Assistant

## 2012-07-20 ENCOUNTER — Ambulatory Visit (INDEPENDENT_AMBULATORY_CARE_PROVIDER_SITE_OTHER): Payer: Medicare Other | Admitting: Pharmacist

## 2012-07-20 DIAGNOSIS — I4891 Unspecified atrial fibrillation: Secondary | ICD-10-CM

## 2012-07-20 DIAGNOSIS — Z7901 Long term (current) use of anticoagulants: Secondary | ICD-10-CM

## 2012-07-30 ENCOUNTER — Ambulatory Visit (INDEPENDENT_AMBULATORY_CARE_PROVIDER_SITE_OTHER): Payer: Medicare Other | Admitting: Cardiology

## 2012-07-30 VITALS — BP 122/82 | HR 58 | Ht 61.0 in | Wt 163.0 lb

## 2012-07-30 DIAGNOSIS — Z7901 Long term (current) use of anticoagulants: Secondary | ICD-10-CM

## 2012-07-30 DIAGNOSIS — I4891 Unspecified atrial fibrillation: Secondary | ICD-10-CM

## 2012-07-30 DIAGNOSIS — I340 Nonrheumatic mitral (valve) insufficiency: Secondary | ICD-10-CM

## 2012-07-30 DIAGNOSIS — I712 Thoracic aortic aneurysm, without rupture: Secondary | ICD-10-CM

## 2012-07-30 DIAGNOSIS — R0602 Shortness of breath: Secondary | ICD-10-CM

## 2012-07-30 DIAGNOSIS — I059 Rheumatic mitral valve disease, unspecified: Secondary | ICD-10-CM

## 2012-07-30 DIAGNOSIS — N259 Disorder resulting from impaired renal tubular function, unspecified: Secondary | ICD-10-CM

## 2012-07-30 DIAGNOSIS — I5022 Chronic systolic (congestive) heart failure: Secondary | ICD-10-CM

## 2012-07-30 MED ORDER — TORSEMIDE 20 MG PO TABS: ORAL_TABLET | ORAL | Status: DC

## 2012-07-30 NOTE — Patient Instructions (Signed)
Your physician has recommended you make the following change in your medication: start taking Torsemide 3 tablets (60 mg) in the morning and 2 tablets (40 mg) in the evening  Your physician recommends that you return for lab work in: 10 days  Your physician recommends that you schedule a follow-up appointment in: 3 weeks with Tereso Newcomer and in 3 months with Dr. Shirlee Latch

## 2012-07-31 ENCOUNTER — Encounter: Payer: Self-pay | Admitting: Cardiology

## 2012-07-31 NOTE — Progress Notes (Signed)
Patient ID: Brianna Robertson, female   DOB: [birth year], 79 y.o.   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  She has a history of dementia, atrial fibrillation, and possible tachycardia-mediated cardiomyopathy.  She went back into atrial fibrillation with rapid ventricular response in 12/11 and was hospitalized in [place]. Myoview showed no ischemia. Echo showed EF 20% with global hypokinesis. She was diuresed and rate was controlled. She was started on coumadin. No bleeding complications since starting coumadin. Repeat echo in 2/12 with rate control showed some improvement in EF, 35-40%. There was moderate to severe mitral regurgitation and mild aortic stenosis.   Most recent echo in 5/13 showed EF 45-50% with mild AS and mild to moderate MR.  She has been in NSR.   Since last appointment, dyspnea seems to be a bit worse.  Weight is up about 5 lbs.  She is short of breath after walking about 200 feet or walking up an incline.  No chest pain.  She eats a fair amount of salted peanuts.    ECG: NSR, old ASMI, RBBB  Labs (2/13): K 4.6, creatinine 1.3, BNP 501 => 325 Labs (3/13): K 4, creatinine 1.3 Labs (8/13): K 4.1, creatinine 1.4, BNP 217 Labs (9/13): KL 4.1, creatinine 1.4, BNP 217, LDL 56, HDl 64 Labs (11/13): K 4.1, creatinine 1.5, BNP 118  Past Medical History:  1. Breast cancer, hx of 1995. S/P lumpectomy and radiation (node, ER, PR negative).  2. Hypertension  3. Hypothyroidism  4. 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 Patient was cardioverted and maintained on amiodarone, and EF improved to 40%. Patient stopped amiodarone because of anorexia and stopped coumadin due to rectus sheath hematoma. Atrial fib with RVR recurred in 12/11. EF down to 20% on echo. Patient was rate-controlled and coumadin was restarted.  Recently, she has been back in NSR.  5. Moderate  dementia  6. CKD: mild  7. Cardiomyopathy: Possibly tachycardia-mediated. In 4/07 in setting of atrial fibrillation with RVR, EF was 30%. After conversion to NSR, EF 40% in 8/09. In 12/11, noted to be back in atrial fibrillation with RVR. Echo (12/11) with EF 20%, severe global hypokinesis, mild to moderate MR, PA systolic pressure 39 mmHg, mild to moderate AS with mean gradient of 13 (may be low gradient AS in the setting of decreased cardiac output). Myoview in 4/07 was negative for ischemia or infarction. Myoview (12/11) showed small fixed apical defect likely due to apical thinning. No ischemia. Echo (2/12): EF 35% with diffuse hypokinesis (worse anteroseptally), mild AS (mean gradient 16 mmHg), mild AI, moderate to severe MR, moderate biatrial enlargement, normal RV, PA systolic pressure 40 mmHg.  Echo (5/13): EF 45-50%, mild AS, mild to moderate MR, PA systolic pressure 52 mmHg.  7. Hyperlipidemia  8. Pulmonary Nodule 9. Aortic stenosis: mild.  10. MItral regurgitation: moderate to severe on 2/12 echo, mild to moderate on 5/13 echo.  11. Asthma 12. Carotid dopplers (9/12): 0-39% bilateral ICA stenosis.  13. Ascending aortic aneurysm: CTA chest 11/13 with 4.4 cm ascending aortic aneurysm (stable compared to prior).    Current Outpatient Prescriptions  Medication Sig Dispense Refill  . calcium carbonate (OS-CAL) 600 MG TABS Take 600 mg by mouth 2 (two) times daily with a meal.        . citalopram (CELEXA) 20 MG tablet Take 20 mg by mouth daily.        Marland Kitchen  Cyanocobalamin (B-12) 2000 MCG TABS Take 1 tablet by mouth daily.      Marland Kitchen donepezil (ARICEPT) 10 MG tablet Take 10 mg by mouth at bedtime as needed.        . ferrous sulfate 325 (65 FE) MG tablet Take 325 mg by mouth daily.      Marland Kitchen letrozole (FEMARA) 2.5 MG tablet Take 1 tablet by mouth Daily.      Marland Kitchen levothyroxine (SYNTHROID, LEVOTHROID) 25 MCG tablet Take 25 mcg by mouth daily.        . memantine (NAMENDA TITRATION PACK) tablet pack Take 5 mg  by mouth daily.       . metoprolol succinate (TOPROL-XL) 50 MG 24 hr tablet take 1 tablet by mouth once daily  30 tablet  11  . potassium chloride SA (KLOR-CON M20) 20 MEQ tablet Take 1 tablet (20 mEq total) by mouth 2 (two) times daily.  180 tablet  2  . simvastatin (ZOCOR) 20 MG tablet Take 20 mg by mouth at bedtime.        Marland Kitchen tiotropium (SPIRIVA) 18 MCG inhalation capsule Place 18 mcg into inhaler and inhale daily.        Marland Kitchen torsemide (DEMADEX) 20 MG tablet Take 3 tablets (60 mg) in the morning and 2 tablets (40 mg) in the evening  150 tablet  3  . valsartan (DIOVAN) 80 MG tablet Take 40 mg by mouth daily.       Marland Kitchen warfarin (COUMADIN) 2.5 MG tablet Take 1 tablet (2.5 mg total) by mouth as directed. Take as directed by Coumadin clinic  70 tablet  3   No current facility-administered medications for this visit.    Allergies: Allergies  Allergen Reactions  . Lasix (Furosemide)     Discontinued by Dr Duaine Dredge  . Penicillins   . Sulfonamide Derivatives     History  Substance Use Topics  . Smoking status: Never Smoker   . Smokeless tobacco: Never Used  . Alcohol Use: No     Comment: very infrequently    Family History:  Family hx of heart disease,cancer, and arthritis.  Brother- pancreatic cancer  PHYSICAL EXAM: VS:  BP 122/82  Pulse 58  Ht 5\' 1"  (1.549 m)  Wt 163 lb (73.936 kg)  BMI 30.81 kg/m2  Well nourished, well developed, in no acute distress HEENT: normal Neck: JVP 8-9 cm Cardiac:  S1, S2, regular; 2/6 systolic murmur RUSB/apex.  Lungs:  Slight crackles at the bases bilaterally.  Abd: soft, nontender  Ext: 1+ ankle edema bilaterally.  Skin: warm and dry Neuro: Alert, oriented, memory is deficient Psych: pleasant   Assessment/Plan:  ATRIAL FIBRILLATION  In NSR today. Continue coumadin. No recent falls. Continue Toprol XL. Chronic systolic heart failure   EF improved to 45-50% on last echo. I think that she has had a tachycardia-mediated cardiomyopathy with LV  function improved now that she has been back in NSR. Continue current doses of Toprol XL and valsartan. She continues to have NYHA class III symptoms that now seem a bit worse.  She is also mildly volume overloaded.  Increase torsemide to 60 qam, 40 qpm.  BMET/BNP in 10 days. We discussed cutting back on sodium intake.  Mitral regurgitation Seems to have improved, only mild-moderate on most recent echo while in NSR. Atrial fibrillation can certainly lead to worsening MR so improvement is not unexpected with resumption of NSR.  CKD Follow creatinine closely with increased torsemide.   Ascending aortic aneurysm Stable at 4.4 cm  in 11/13.  She would not be a good surgical candidate and aneurysm has been stable.  I asked her daughter to discuss utility of further CTs with Dr. Dorris Fetch before doing another one.   Followup with PA in 3 wks and me in 3 months.   Marca Ancona 07/31/2012

## 2012-08-05 ENCOUNTER — Telehealth: Payer: Self-pay | Admitting: Cardiology

## 2012-08-05 NOTE — Telephone Encounter (Signed)
Spoke with daughter,Lisa. She is aware I will fax a signed lab order for BMET/BNP to be done 08/10/12 to Dr Duaine Dredge 08/07/12.

## 2012-08-05 NOTE — Telephone Encounter (Signed)
New problem    PCP is willing to drawn lab work . Please fax an order over.    Office # [phone]. Dr. Mosetta Putt

## 2012-08-09 ENCOUNTER — Other Ambulatory Visit: Payer: Self-pay | Admitting: Cardiology

## 2012-08-10 ENCOUNTER — Other Ambulatory Visit: Payer: Medicare Other

## 2012-08-10 ENCOUNTER — Telehealth: Payer: Self-pay | Admitting: Cardiology

## 2012-08-10 NOTE — Telephone Encounter (Signed)
New problem   Pt's daughter stated you was suppose to fax a blood work order to Dr Jari Favre @ fax # [phone] but office haven't received it yet.

## 2012-08-10 NOTE — Telephone Encounter (Signed)
Order was faxed 08/07/12. Spoke with daughter,Lisa. Dr Duaine Dredge did not receive order for lab--will re fax.

## 2012-08-13 ENCOUNTER — Encounter: Payer: Self-pay | Admitting: Physician Assistant

## 2012-08-17 ENCOUNTER — Ambulatory Visit (INDEPENDENT_AMBULATORY_CARE_PROVIDER_SITE_OTHER): Payer: Medicare Other | Admitting: *Deleted

## 2012-08-17 ENCOUNTER — Encounter: Payer: Self-pay | Admitting: Physician Assistant

## 2012-08-17 ENCOUNTER — Ambulatory Visit (INDEPENDENT_AMBULATORY_CARE_PROVIDER_SITE_OTHER): Payer: Medicare Other | Admitting: Physician Assistant

## 2012-08-17 VITALS — BP 114/60 | HR 58 | Ht 61.0 in | Wt 160.1 lb

## 2012-08-17 DIAGNOSIS — I4891 Unspecified atrial fibrillation: Secondary | ICD-10-CM

## 2012-08-17 DIAGNOSIS — I5022 Chronic systolic (congestive) heart failure: Secondary | ICD-10-CM

## 2012-08-17 DIAGNOSIS — I059 Rheumatic mitral valve disease, unspecified: Secondary | ICD-10-CM

## 2012-08-17 DIAGNOSIS — Z7901 Long term (current) use of anticoagulants: Secondary | ICD-10-CM

## 2012-08-17 DIAGNOSIS — I340 Nonrheumatic mitral (valve) insufficiency: Secondary | ICD-10-CM

## 2012-08-17 DIAGNOSIS — N189 Chronic kidney disease, unspecified: Secondary | ICD-10-CM

## 2012-08-17 DIAGNOSIS — I10 Essential (primary) hypertension: Secondary | ICD-10-CM

## 2012-08-17 LAB — POCT INR: INR: 2.3

## 2012-08-17 NOTE — Progress Notes (Signed)
[address] [place], [place]  [address] Phone: [phone] Fax:  [phone]  Date:  08/17/2012   ID:  Brianna Robertson, DOB Oct 26, 1925, MRN [id number]  PCP:  Carolyne Fiscal, MD  Primary Cardiologist:  Dr. Marca Ancona     History of Present Illness: Brianna Robertson is a 79 y.o. female who returns for f/u on CHF.    She has a hx of dementia, AFib, and possible tachycardia-mediated cardiomyopathy. She went back into AFib with RVR in 12/11 and was hospitalized in [place]. Myoview showed no ischemia. Echo showed EF 20% with global hypokinesis. She was diuresed and rate was controlled. She was started on coumadin. She has had no bleeding complications since starting coumadin. Repeat echo in 2/12 with rate control showed some improvement in EF, 35-40%. There was moderate to severe mitral regurgitation and mild aortic stenosis. Most recent echo in 5/13 showed EF 45-50% with mild AS and mild to moderate MR.  Seen by Dr. Marca Ancona 3/7 and weight was up about 5 lbs.  She noted increased DOE.  Torsemide was increased. She was brought back today for follow up.  She is doing well.  Hx is somewhat difficult with her dementia.  Daughter has not noted much difference in her dyspnea.  She feels Brianna Robertson's DOE is stable.  She is NYHA Class III. No orthopnea, PND.  LE edema stable.  No chest pain.  No syncope.    Labs (2/13): K 4.6, creatinine 1.3, BNP 501 => 325  Labs (3/13): K 4, creatinine 1.3  Labs (8/13): K 4.1, creatinine 1.4, BNP 217  Labs (9/13): K 4.1, creatinine 1.4, BNP 217, LDL 56, HDl 64  Labs (11/13): K 4.1, creatinine 1.5, BNP 118  Labs (3/14):  K 3.9, BUN 22, Cr 1.34, Hgb 12.1, BNP 252.6  Wt Readings from Last 3 Encounters:  08/17/12 160 lb 1.9 oz (72.63 kg)  07/30/12 163 lb (73.936 kg)  04/14/12 150 lb (68.04 kg)    Past Medical History:  1. Breast cancer, hx of 1995. S/P lumpectomy and radiation (node, ER, PR negative).  2. Hypertension    3. Hypothyroidism  4. 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 Patient was cardioverted and maintained on amiodarone, and EF improved to 40%. Patient stopped amiodarone because of anorexia and stopped coumadin due to rectus sheath hematoma. Atrial fib with RVR recurred in 12/11. EF down to 20% on echo. Patient was rate-controlled and coumadin was restarted. Recently, she has been back in NSR.  5. Moderate dementia  6. CKD: mild  7. Cardiomyopathy: Possibly tachycardia-mediated. In 4/07 in setting of atrial fibrillation with RVR, EF was 30%. After conversion to NSR, EF 40% in 8/09. In 12/11, noted to be back in atrial fibrillation with RVR. Echo (12/11) with EF 20%, severe global hypokinesis, mild to moderate MR, PA systolic pressure 39 mmHg, mild to moderate AS with mean gradient of 13 (may be low gradient AS in the setting of decreased cardiac output). Myoview in 4/07 was negative for ischemia or infarction. Myoview (12/11) showed small fixed apical defect likely due to apical thinning. No ischemia. Echo (2/12): EF 35% with diffuse hypokinesis (worse anteroseptally), mild AS (mean gradient 16 mmHg), mild AI, moderate to severe MR, moderate biatrial enlargement, normal RV, PA systolic pressure 40 mmHg. Echo (5/13): EF 45-50%, mild AS, mild to moderate MR, PA systolic pressure 52 mmHg.  7. Hyperlipidemia  8. Pulmonary Nodule  9. Aortic stenosis: mild.  10. MItral regurgitation: moderate to severe on 2/12 echo, mild to moderate on 5/13 echo.  11. Asthma  12. Carotid dopplers (9/12): 0-39% bilateral ICA stenosis.  13. Ascending aortic aneurysm: CTA chest 11/13 with 4.4 cm ascending aortic aneurysm (stable compared to prior).    Current Outpatient Prescriptions  Medication Sig Dispense Refill  . calcium carbonate (OS-CAL) 600 MG TABS Take 600 mg by mouth 2 (two) times daily with a meal.        . citalopram (CELEXA) 20 MG tablet Take 20 mg by  mouth daily.        . Cyanocobalamin (B-12) 2000 MCG TABS Take 1 tablet by mouth daily.      Marland Kitchen DIOVAN 80 MG tablet take 1/2 to 1 tablet by mouth every morning as directed  90 tablet  3  . donepezil (ARICEPT) 10 MG tablet Take 10 mg by mouth at bedtime as needed.        . ferrous sulfate 325 (65 FE) MG tablet Take 325 mg by mouth daily.      Marland Kitchen letrozole (FEMARA) 2.5 MG tablet Take 1 tablet by mouth Daily.      Marland Kitchen levothyroxine (SYNTHROID, LEVOTHROID) 25 MCG tablet Take 25 mcg by mouth daily.        . memantine (NAMENDA TITRATION PACK) tablet pack Take 5 mg by mouth daily.       . metoprolol succinate (TOPROL-XL) 50 MG 24 hr tablet take 1 tablet by mouth once daily  30 tablet  11  . potassium chloride SA (KLOR-CON M20) 20 MEQ tablet Take 1 tablet (20 mEq total) by mouth 2 (two) times daily.  180 tablet  2  . simvastatin (ZOCOR) 20 MG tablet Take 20 mg by mouth at bedtime.        Marland Kitchen tiotropium (SPIRIVA) 18 MCG inhalation capsule Place 18 mcg into inhaler and inhale daily.        Marland Kitchen torsemide (DEMADEX) 20 MG tablet Take 3 tablets (60 mg) in the morning and 2 tablets (40 mg) in the evening  150 tablet  3  . warfarin (COUMADIN) 2.5 MG tablet Take 1 tablet (2.5 mg total) by mouth as directed. Take as directed by Coumadin clinic  70 tablet  3   No current facility-administered medications for this visit.    Allergies:    Allergies  Allergen Reactions  . Lasix (Furosemide)     Discontinued by Dr Duaine Dredge  . Penicillins   . Sulfonamide Derivatives     Social History:  The patient  reports that she has never smoked. She has never used smokeless tobacco. She reports that she does not drink alcohol or use illicit drugs.   ROS:  Please see the history of present illness.      All other systems reviewed and negative.   PHYSICAL EXAM: VS:  BP 114/60  Pulse 58  Ht 5\' 1"  (1.549 m)  Wt 160 lb 1.9 oz (72.63 kg)  BMI 30.27 kg/m2 Well nourished, well developed, in no acute distress HEENT: normal Neck:  no JVD Cardiac:  normal S1, S2; RRR; 2/6 systolic murmur RUSB/LLSB Lungs:  Faint crackles in the bases bilaterally that mostly clear with coughing, no wheezing, rhonchi  Abd: soft, nontender, no hepatomegaly Ext: 1+ bilateral ankle edema Skin: warm and dry Neuro:  CNs 2-12 intact, no focal abnormalities noted  EKG:  Sinus brady, HR 58, no change from prior tracings     ASSESSMENT AND PLAN:  1. Chronic Systolic CHF:  Weight  is down some.  Creatinine stable.  Breathing is stable.  Exam is stable.  Continue current dose of diuretics.  Plan follow up bmet again in 2 weeks. 2. Atrial Fibrillation:  Maintaining NSR.  She remains on coumadin. 3. Mitral Regurgitation:  Improved by echo in 5/13. 4. Hypertension:  Controlled.  Continue current therapy.  5. CKD:  Check bmet again in 2 weeks. 6. Disposition:  F/u with Dr. Marca Ancona as planned in 10/2012.  Luna Glasgow, PA-C  10:58 AM 08/17/2012

## 2012-08-17 NOTE — Patient Instructions (Addendum)
YOU HAVE BEEN GIVEN AN RX FOR LAB WORK TO BE DONE 2 WEEKS WITH YOUR PCP; PLEASE HAVE RESULTS FAXED TO  [facility], PAC [phone]  NO CHANGES WERE MADE TODAY  KEEP APPT WITH DR. Shirlee Latch 10/2012

## 2012-09-09 ENCOUNTER — Other Ambulatory Visit: Payer: Self-pay | Admitting: Cardiology

## 2012-09-09 ENCOUNTER — Other Ambulatory Visit: Payer: Self-pay | Admitting: *Deleted

## 2012-09-09 MED ORDER — WARFARIN SODIUM 2.5 MG PO TABS: 2.5000 mg | ORAL_TABLET | ORAL | Status: DC

## 2012-09-28 ENCOUNTER — Other Ambulatory Visit: Payer: Self-pay | Admitting: *Deleted

## 2012-09-28 ENCOUNTER — Ambulatory Visit (INDEPENDENT_AMBULATORY_CARE_PROVIDER_SITE_OTHER): Payer: Medicare Other | Admitting: *Deleted

## 2012-09-28 ENCOUNTER — Telehealth: Payer: Self-pay | Admitting: *Deleted

## 2012-09-28 ENCOUNTER — Other Ambulatory Visit: Payer: Self-pay | Admitting: Nurse Practitioner

## 2012-09-28 DIAGNOSIS — I4891 Unspecified atrial fibrillation: Secondary | ICD-10-CM

## 2012-09-28 DIAGNOSIS — Z7901 Long term (current) use of anticoagulants: Secondary | ICD-10-CM

## 2012-09-28 NOTE — Telephone Encounter (Signed)
s/w pt's daughter Misty Stanley who has been notified about lab results that were done at Dr. Geoffery Lyons office on 09/10/12. I called Dr. Geoffery Lyons office for them to fax results to [place] today. no changes to be made today per SW, PA

## 2012-09-28 NOTE — Telephone Encounter (Signed)
Error, refill already sent in today

## 2012-10-22 ENCOUNTER — Ambulatory Visit (INDEPENDENT_AMBULATORY_CARE_PROVIDER_SITE_OTHER): Payer: Medicare Other | Admitting: *Deleted

## 2012-10-22 DIAGNOSIS — Z7901 Long term (current) use of anticoagulants: Secondary | ICD-10-CM

## 2012-10-22 DIAGNOSIS — I4891 Unspecified atrial fibrillation: Secondary | ICD-10-CM

## 2012-10-22 LAB — POCT INR: INR: 2.9

## 2012-11-10 ENCOUNTER — Ambulatory Visit: Payer: Medicare Other | Admitting: Cardiology

## 2012-11-24 ENCOUNTER — Ambulatory Visit (INDEPENDENT_AMBULATORY_CARE_PROVIDER_SITE_OTHER): Payer: Medicare Other | Admitting: Nurse Practitioner

## 2012-11-24 ENCOUNTER — Ambulatory Visit (INDEPENDENT_AMBULATORY_CARE_PROVIDER_SITE_OTHER): Payer: Medicare Other | Admitting: *Deleted

## 2012-11-24 ENCOUNTER — Encounter: Payer: Self-pay | Admitting: Nurse Practitioner

## 2012-11-24 VITALS — BP 98/58 | HR 60 | Ht 65.0 in | Wt 159.8 lb

## 2012-11-24 DIAGNOSIS — I5022 Chronic systolic (congestive) heart failure: Secondary | ICD-10-CM

## 2012-11-24 DIAGNOSIS — I4891 Unspecified atrial fibrillation: Secondary | ICD-10-CM

## 2012-11-24 DIAGNOSIS — Z7901 Long term (current) use of anticoagulants: Secondary | ICD-10-CM

## 2012-11-24 LAB — BASIC METABOLIC PANEL
BUN: 20 mg/dL (ref 6–23)
CO2: 38 mEq/L — ABNORMAL HIGH (ref 19–32)
Calcium: 9.8 mg/dL (ref 8.4–10.5)
Chloride: 95 mEq/L — ABNORMAL LOW (ref 96–112)
Creatinine, Ser: 1.5 mg/dL — ABNORMAL HIGH (ref 0.4–1.2)
GFR: 36.16 mL/min — ABNORMAL LOW (ref >60.00)
Glucose, Bld: 96 mg/dL (ref 70–99)
Potassium: 3.8 mEq/L (ref 3.5–5.1)
Sodium: 138 mEq/L (ref 135–145)

## 2012-11-24 LAB — POCT INR: INR: 2.8

## 2012-11-24 MED ORDER — WARFARIN SODIUM 2.5 MG PO TABS: 2.5000 mg | ORAL_TABLET | ORAL | Status: DC

## 2012-11-24 MED ORDER — VALSARTAN 80 MG PO TABS: 40.0000 mg | ORAL_TABLET | Freq: Every day | ORAL | Status: DC

## 2012-11-24 NOTE — Progress Notes (Addendum)
Mardene Speak Date of Birth: 10/13/1925 Medical Record #[id number]  History of Present Illness: Brianna Robertson is seen back today for a 3 month check. Seen for Dr. Shirlee Latch. Has dementia, atrial fib and possible tachycardia-mediated CM. She went back into AFib with RVR in 12/11 and was hospitalized in [place]. Myoview showed no ischemia. Echo showed EF 20% with global hypokinesis. She was diuresed and rate was controlled. She was started on coumadin. She has had no bleeding complications since starting coumadin. Repeat echo in 2/12 with rate control showed some improvement in EF, 35-40%. There was moderate to severe mitral regurgitation and mild aortic stenosis. Most recent echo in 5/13 showed EF 45-50% with mild AS and mild to moderate MR.   Her other issues include prior breast cancer, HTN, hypothyroidism, CKD, HLD, mild AS, moderate to severe MR, mild bilateral ICA stenosis and ascending aortic aneurysm last measured in November 2013 at 4.4 cm.   Seen 3 months ago by Tereso Newcomer, PA. Felt to be doing well. Stable symptoms.   Comes in today. Here with her daughter. Daughter provides most of the history due to the patient's dementia. No problems noted. Says [name] "is holding her own". No chest pain reported. Breathing is stable. No worse and no better. Weight has been stable. She is pleasantly confused. No chest pain. Not dizzy or lightheaded. BP has been chronically on the low normal side and is unchanged. Daughter is only giving 1/2 dose Diovan.   Current Outpatient Prescriptions  Medication Sig Dispense Refill  . calcium carbonate (OS-CAL) 600 MG TABS Take 600 mg by mouth 2 (two) times daily with a meal.        . citalopram (CELEXA) 20 MG tablet Take 20 mg by mouth daily.        . Cyanocobalamin (B-12) 2000 MCG TABS Take 1 tablet by mouth daily.      Marland Kitchen DIOVAN 80 MG tablet take 1/2 to 1 tablet by mouth every morning as directed  90 tablet  3  . donepezil (ARICEPT) 10 MG tablet Take 10 mg  by mouth at bedtime as needed.        . ferrous sulfate 325 (65 FE) MG tablet Take 325 mg by mouth daily.      Marland Kitchen KLOR-CON M20 20 MEQ tablet take 1 tablet by mouth twice a day  180 tablet  2  . letrozole (FEMARA) 2.5 MG tablet Take 1 tablet by mouth Daily.      Marland Kitchen levothyroxine (SYNTHROID, LEVOTHROID) 25 MCG tablet Take 25 mcg by mouth daily.        . memantine (NAMENDA TITRATION PACK) tablet pack Take 5 mg by mouth daily.       . metoprolol succinate (TOPROL-XL) 50 MG 24 hr tablet take 1 tablet by mouth once daily  30 tablet  11  . simvastatin (ZOCOR) 20 MG tablet Take 20 mg by mouth at bedtime.        Marland Kitchen tiotropium (SPIRIVA) 18 MCG inhalation capsule Place 18 mcg into inhaler and inhale daily.        Marland Kitchen torsemide (DEMADEX) 20 MG tablet Take 3 tablets (60 mg) in the morning and 2 tablets (40 mg) in the evening  150 tablet  3  . warfarin (COUMADIN) 2.5 MG tablet Take 1 tablet (2.5 mg total) by mouth as directed.  180 tablet  0   No current facility-administered medications for this visit.    Allergies  Allergen Reactions  . Lasix (Furosemide)  Discontinued by Dr Duaine Dredge  . Penicillins   . Sulfonamide Derivatives     Past Medical History:  1. Breast cancer, hx of 1995. S/P lumpectomy and radiation (node, ER, PR negative).  2. Hypertension  3. Hypothyroidism  4. 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 Patient was cardioverted and maintained on amiodarone, and EF improved to 40%. Patient stopped amiodarone because of anorexia and stopped coumadin due to rectus sheath hematoma. Atrial fib with RVR recurred in 12/11. EF down to 20% on echo. Patient was rate-controlled and coumadin was restarted. Recently, she has been back in NSR.  5. Moderate dementia  6. CKD: mild  7. Cardiomyopathy: Possibly tachycardia-mediated. In 4/07 in setting of atrial fibrillation with RVR, EF was 30%. After conversion to NSR, EF 40% in 8/09. In 12/11, noted  to be back in atrial fibrillation with RVR. Echo (12/11) with EF 20%, severe global hypokinesis, mild to moderate MR, PA systolic pressure 39 mmHg, mild to moderate AS with mean gradient of 13 (may be low gradient AS in the setting of decreased cardiac output). Myoview in 4/07 was negative for ischemia or infarction. Myoview (12/11) showed small fixed apical defect likely due to apical thinning. No ischemia. Echo (2/12): EF 35% with diffuse hypokinesis (worse anteroseptally), mild AS (mean gradient 16 mmHg), mild AI, moderate to severe MR, moderate biatrial enlargement, normal RV, PA systolic pressure 40 mmHg. Echo (5/13): EF 45-50%, mild AS, mild to moderate MR, PA systolic pressure 52 mmHg.  7. Hyperlipidemia  8. Pulmonary Nodule  9. Aortic stenosis: mild.  10. MItral regurgitation: moderate to severe on 2/12 echo, mild to moderate on 5/13 echo.  11. Asthma  12. Carotid dopplers (9/12): 0-39% bilateral ICA stenosis.  13. Ascending aortic aneurysm: CTA chest 11/13 with 4.4 cm ascending aortic aneurysm (stable compared to prior).    Past Medical History  Diagnosis Date  . Breast cancer 1995    bilateral  . HTN (hypertension)   . Hypothyroidism   . Atrial fibrillation 08/2005  . Dementia     moderate  . CKD (chronic kidney disease)     mild  . NICM (nonischemic cardiomyopathy)     a. possibly tachycardia-mediated;  b. 09/2011 Echo: EF 45-50%  . Hyperlipemia   . Pulmonary nodule 06/2006  . LBBB (left bundle branch block)   . Aortic stenosis     a. 09/2011 Echo: Mild AS, severely Ca2+ leaflets.  . Mitral regurgitation     a. 09/2011 Echo:  Mild-moderate (previously read as mod-sev).  . COPD (chronic obstructive pulmonary disease)   . Thoracic aortic aneurysm   . Depression   . Chronic systolic CHF (congestive heart failure)     a. 09/2011 Echo: EF 45-50%  . Scoliosis   . History of pneumonia 2010  . Insomnia   . Rotator cuff disorder     right  . Hearing loss   . Vitamin D deficiency    . History of transfusion of whole blood     Past Surgical History  Procedure Laterality Date  . Breast lumpectomy  1995, 2008    bilateral, radiation(node,ER, PR negative)  . Excision of thyroid tumor  1978    presumable benign  . Cataract extraction      2010 right & lens implant, 2011 left & lens implant  . 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 Excision of right nipple for fibrosis with a giant cell reaction  2011  History  Smoking status  . Never Smoker   Smokeless tobacco  . Never Used    History  Alcohol Use No    Comment: very infrequently    Family History  Problem Relation Age of Onset  . Liver cancer Brother   . Heart attack Father 22  . Cancer    . Arthritis    . Heart disease Mother     CHF  . Dementia Mother   . Heart attack Brother   . Other Brother     staph infection  . Dementia Sister     Review of Systems: The review of systems is per the HPI.  All other systems were reviewed and are negative.  Physical Exam: BP 98/58  Pulse 60  Ht 5\' 5"  (1.651 m)  Wt 159 lb 12.8 oz (72.485 kg)  BMI 26.59 kg/m2 Patient is very pleasant and in no acute distress. Weight is stable and only up one pound. Skin is warm and dry. Color is normal.  HEENT is unremarkable. Normocephalic/atraumatic. PERRL. Sclera are nonicteric. Neck is supple. No masses. No JVD. Lungs are clear. Cardiac exam shows a regular rate and rhythm. Systolic murmur noted.  Abdomen is soft. Extremities are quite fleshy with 1+ ankle edema. Gait and ROM are intact. No gross neurologic deficits noted. She is pleasantly confused.   LABORATORY DATA: BMET is pending  Lab Results  Component Value Date   WBC 4.1 08/03/2011   HGB 11.2* 08/03/2011   HCT 35.4* 08/03/2011   PLT 193 08/03/2011   GLUCOSE 97 03/30/2012   CHOL 134 01/30/2012   TRIG 67.0 01/30/2012   HDL 64.40 01/30/2012   LDLCALC 56 01/30/2012   ALT 9 11/29/2008   AST 12 11/29/2008   NA 139 03/30/2012   K 4.1 03/30/2012   CL  95* 03/30/2012   CREATININE 1.5* 03/30/2012   BUN 23 03/30/2012   CO2 38* 03/30/2012   TSH 1.230 11/29/2008   INR 2.9 10/22/2012   Lab Results  Component Value Date   INR 2.9 10/22/2012   INR 3.4 09/28/2012   INR 2.3 08/17/2012     Echo Study Conclusions from May 2013  - Left ventricle: The cavity size was normal. Wall thickness was normal. Systolic function was mildly reduced. The estimated ejection fraction was in the range of 45% to 50%. - Aortic valve: Mildly calcified annulus. Severely thickened, severely calcified leaflets. There was mild stenosis. Trivial regurgitation. Mean gradient: 14mm Hg (S). Peak gradient: 19mm Hg (S). - Mitral valve: Mild to moderate regurgitation. - Left atrium: The atrium was mildly dilated. - Right atrium: The atrium was mildly dilated. - Tricuspid valve: Moderate regurgitation. - Pulmonary arteries: Systolic pressure was moderately increased. PA peak pressure: 52mm Hg (S).   Assessment / Plan: 1. Chronic systolic CHF - seems to be holding her own. Check BMET at this time. Would favor conservative management. See Dr. Shirlee Latch in 4 months.   2. PAF - in sinus by exam today.   3. Chronic coumadin therapy - checking INR today. Daughter is asking about alternative therapy. Cost may be prohibitive.   4. HTN - BP is chronically on the low side of normal - unchanged - no symptoms - left on her current regimen.   5. Dementia  See Dr. Shirlee Latch in 4 months.   Patient is agreeable to this plan and will call if any problems develop in the interim.   Rosalio Macadamia, RN, ANP-C [facility] [address] [place], [place]  [address]

## 2012-11-24 NOTE — Patient Instructions (Signed)
We will check labs today  Stay on current medicines  See Dr. Shirlee Latch in 4 months  Call the [facility] office at [phone] if you have any questions, problems or concerns.

## 2012-11-26 ENCOUNTER — Telehealth: Payer: Self-pay | Admitting: Nurse Practitioner

## 2012-11-26 NOTE — Telephone Encounter (Signed)
S/w pt's daughter aware of lab results and also that I mailed a copy

## 2012-11-26 NOTE — Telephone Encounter (Signed)
New Problem:    Patient's daughter called in wanting to know the results of her mother's latest labs.  Please call back.

## 2013-01-01 ENCOUNTER — Ambulatory Visit (INDEPENDENT_AMBULATORY_CARE_PROVIDER_SITE_OTHER): Payer: Medicare Other | Admitting: Pharmacist

## 2013-01-01 DIAGNOSIS — I4891 Unspecified atrial fibrillation: Secondary | ICD-10-CM

## 2013-01-01 DIAGNOSIS — Z7901 Long term (current) use of anticoagulants: Secondary | ICD-10-CM

## 2013-01-03 ENCOUNTER — Other Ambulatory Visit: Payer: Self-pay | Admitting: Cardiology

## 2013-01-04 ENCOUNTER — Telehealth: Payer: Self-pay | Admitting: Pharmacist

## 2013-01-04 DIAGNOSIS — I4891 Unspecified atrial fibrillation: Secondary | ICD-10-CM

## 2013-01-04 DIAGNOSIS — Z7901 Long term (current) use of anticoagulants: Secondary | ICD-10-CM

## 2013-01-04 DIAGNOSIS — R0602 Shortness of breath: Secondary | ICD-10-CM

## 2013-01-04 MED ORDER — TORSEMIDE 20 MG PO TABS: ORAL_TABLET | ORAL | Status: DC

## 2013-01-04 MED ORDER — POTASSIUM CHLORIDE CRYS ER 20 MEQ PO TBCR: EXTENDED_RELEASE_TABLET | ORAL | Status: DC

## 2013-01-04 NOTE — Telephone Encounter (Signed)
Spoke with pt's daughter.  After her visit on Friday she realized that her mother had missed 4 doses of Coumadin.  This would explain why pt's INR was 1.3.  Instructed to continue with previous dosing instructions.

## 2013-01-04 NOTE — Telephone Encounter (Signed)
New problem    pts daughter has question regarding how pt is to take her coumadin

## 2013-01-19 ENCOUNTER — Ambulatory Visit (INDEPENDENT_AMBULATORY_CARE_PROVIDER_SITE_OTHER): Payer: Medicare Other | Admitting: *Deleted

## 2013-01-19 DIAGNOSIS — Z7901 Long term (current) use of anticoagulants: Secondary | ICD-10-CM

## 2013-01-19 DIAGNOSIS — I4891 Unspecified atrial fibrillation: Secondary | ICD-10-CM

## 2013-01-19 LAB — POCT INR: INR: 1.8

## 2013-02-02 ENCOUNTER — Ambulatory Visit (INDEPENDENT_AMBULATORY_CARE_PROVIDER_SITE_OTHER): Payer: Medicare Other | Admitting: *Deleted

## 2013-02-02 DIAGNOSIS — Z7901 Long term (current) use of anticoagulants: Secondary | ICD-10-CM

## 2013-02-02 DIAGNOSIS — I4891 Unspecified atrial fibrillation: Secondary | ICD-10-CM

## 2013-02-02 LAB — POCT INR: INR: 2.7

## 2013-02-26 ENCOUNTER — Other Ambulatory Visit: Payer: Self-pay | Admitting: Cardiology

## 2013-03-02 ENCOUNTER — Ambulatory Visit (INDEPENDENT_AMBULATORY_CARE_PROVIDER_SITE_OTHER): Payer: Medicare Other | Admitting: General Practice

## 2013-03-02 DIAGNOSIS — Z7901 Long term (current) use of anticoagulants: Secondary | ICD-10-CM

## 2013-03-02 DIAGNOSIS — I4891 Unspecified atrial fibrillation: Secondary | ICD-10-CM

## 2013-03-02 LAB — POCT INR: INR: 3.8

## 2013-03-16 ENCOUNTER — Ambulatory Visit (INDEPENDENT_AMBULATORY_CARE_PROVIDER_SITE_OTHER): Payer: Medicare Other | Admitting: Neurology

## 2013-03-16 ENCOUNTER — Ambulatory Visit (INDEPENDENT_AMBULATORY_CARE_PROVIDER_SITE_OTHER): Payer: Medicare Other | Admitting: *Deleted

## 2013-03-16 ENCOUNTER — Encounter: Payer: Self-pay | Admitting: Neurology

## 2013-03-16 VITALS — BP 106/55 | HR 59 | Temp 97.9°F | Ht 63.0 in | Wt 161.0 lb

## 2013-03-16 DIAGNOSIS — Z7901 Long term (current) use of anticoagulants: Secondary | ICD-10-CM

## 2013-03-16 DIAGNOSIS — I4891 Unspecified atrial fibrillation: Secondary | ICD-10-CM

## 2013-03-16 DIAGNOSIS — F329 Major depressive disorder, single episode, unspecified: Secondary | ICD-10-CM

## 2013-03-16 DIAGNOSIS — F03918 Unspecified dementia, unspecified severity, with other behavioral disturbance: Secondary | ICD-10-CM

## 2013-03-16 DIAGNOSIS — I509 Heart failure, unspecified: Secondary | ICD-10-CM

## 2013-03-16 DIAGNOSIS — J4489 Other specified chronic obstructive pulmonary disease: Secondary | ICD-10-CM

## 2013-03-16 DIAGNOSIS — J449 Chronic obstructive pulmonary disease, unspecified: Secondary | ICD-10-CM

## 2013-03-16 DIAGNOSIS — F3289 Other specified depressive episodes: Secondary | ICD-10-CM

## 2013-03-16 DIAGNOSIS — I5042 Chronic combined systolic (congestive) and diastolic (congestive) heart failure: Secondary | ICD-10-CM

## 2013-03-16 DIAGNOSIS — F0391 Unspecified dementia with behavioral disturbance: Secondary | ICD-10-CM

## 2013-03-16 MED ORDER — MEMANTINE HCL ER 7 MG PO CP24: 7.0000 mg | ORAL_CAPSULE | Freq: Every day | ORAL | Status: DC

## 2013-03-16 NOTE — Patient Instructions (Addendum)
I think overall you are doing fairly well but I do want to suggest a few things today:  Remember to drink plenty of fluid, eat healthy meals and do not skip any meals. Try to eat protein with a every meal and eat a healthy snack such as fruit or nuts in between meals. Try to keep a regular sleep-wake schedule and try to exercise daily, particularly in the form of walking, 20-30 minutes a day, if you can.   Engage in social activities in your community and with your family and try to keep up with current events by reading the newspaper or watching the news.   As far as your medications are concerned, I would like to suggest a trial of Namenda XR 7 mg once daily for 30 days. You can start next week. Lesa: Please call in about 4 weeks for an update, at which time we may go up to 14 mg once daily.   As far as diagnostic testing: blood work and MRI brain.  I would like to see you back in 3 months, sooner if we need to. Please call us with any interim questions, concerns, problems, updates or refill requests.  Please also call us for any test results so we can go over those with you on the phone. Brett Canales is my clinical assistant and will answer any of your questions and relay your messages to me and also relay most of my messages to you.  Our phone number is [phone]. We also have an after hours call service for urgent matters and there is a physician on-call for urgent questions. For any emergencies you know to call 911 or go to the nearest emergency room.

## 2013-03-16 NOTE — Progress Notes (Signed)
Subjective:    Patient ID: Brianna Robertson is a 79 y.o. female.  HPI  Brianna Foley, MD, PhD [facility] [address], [address] [address] [place], [place] [address]  Dear Dr. Duaine Robertson,    I saw your patient, Brianna Robertson, upon your kind request in my neurologic clinic today for initial consultation of her dementia. The patient is accompanied by her daughter, Brianna Robertson, today. As you know, Brianna Robertson is a very pleasant 79 year old right-handed woman with an underlying medical history of congestive heart failure with cardiomyopathy, COPD, depression, hearing loss, history of breast cancer, HLP, hypothyroidism, atrial fibrillation, squamous cell carcinoma of the leg, scoliosis, thoracic aortic aneurysm, and vitamin D deficiency, who has had memory loss for over 6 years. This has been slowly progressive. In the past several months there have been issues with anger and verbal outbursts, but no physical outbursts. She lives alone, but has a care provider, who comes 3 times a day and spends about 8 hour a day. [facility] for 4 hours each time. There are cameras in the house which Brianna Robertson and Brianna Robertson can access real time. She denies VH, or AH. She stays alone overnight. She has had some vivid dreams. I reviewed your office notes and laboratory Robertson results from 2013. I also reviewed the Robertson results from 11/24/2012 which included a BMP, which showed a creatinine of 1.5, chloride of 95, CO2 of 38. She had a brain MRI with and without contrast on 11/03/2009, and I reviewed the Robertson result: Atrophy and mild chronic microvascular ischemia in the white matter. No acute infarct. She has been on Aricept 10 mg at night for the past 5-6 years. She has been seeing you for the past 4 years. She was also started on Namenda about the same time and is currently on 5 mg daily, d/t possible SE including HAs reported on 5 mg bid. She has been on the 5 mg once daily for  the past 18 to 24 months. For her depression she is on Celexa.  There is a FHx of dementia in her M. She had 4 brothers, all deceased from heart d/s and cancer, and 2 sisters, out of whom 1 is deceased and she had dementia.     Her Past Medical History Is Significant For: Past Medical History  Diagnosis Date  . Breast cancer 1995    bilateral  . HTN (hypertension)   . Hypothyroidism   . Atrial fibrillation 08/2005  . Dementia     moderate  . CKD (chronic kidney disease)     mild  . NICM (nonischemic cardiomyopathy)     a. possibly tachycardia-mediated;  b. 09/2011 Echo: EF 45-50%  . Hyperlipemia   . Pulmonary nodule 06/2006  . LBBB (left bundle branch block)   . Aortic stenosis     a. 09/2011 Echo: Mild AS, severely Ca2+ leaflets.  . Mitral regurgitation     a. 09/2011 Echo:  Mild-moderate (previously read as mod-sev).  . COPD (chronic obstructive pulmonary disease)   . Thoracic aortic aneurysm   . Depression   . Chronic systolic CHF (congestive heart failure)     a. 09/2011 Echo: EF 45-50%  . Scoliosis   . History of pneumonia 2010  . Insomnia   . Rotator cuff disorder     right  . Hearing loss   . Vitamin D deficiency   . History of transfusion of whole blood  Her Past Surgical History Is Significant For: Past Surgical History  Procedure Laterality Date  . Breast lumpectomy  1995, 2008    bilateral, radiation(node,ER, PR negative)  . Excision of thyroid tumor  1978    presumable benign  . Cataract extraction      2010 right & lens implant, 2011 left & lens implant  . 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 Excision of right nipple for fibrosis with a giant cell reaction  2011    Her Family History Is Significant For: Family History  Problem Relation Age of Onset  . Liver cancer Brother   . Heart attack Father 74  . Cancer    . Arthritis    . Heart disease Mother     CHF  . Dementia Mother   . Heart attack Brother   . Other Brother      staph infection  . Dementia Sister     Her Social History Is Significant For: History   Social History  . Marital Status: Widowed    Spouse Name: N/A    Number of Children: 2  . Years of Education: College   Occupational History  . retired Engineer, site   . retired Advice worker    Social History Main Topics  . Smoking status: Never Smoker   . Smokeless tobacco: Never Used  . Alcohol Use: No     Comment: very infrequently  . Drug Use: No  . Sexual Activity: Not Currently   Other Topics Concern  . None   Social History Narrative   Patient lives at home alone.   Caffeine Use: 1 cup daily    Her Allergies Are:  Allergies  Allergen Reactions  . Lasix [Furosemide]     Discontinued by Dr Brianna Robertson  . Penicillins   . Sulfonamide Derivatives   :   Her Current Medications Are:  Outpatient Encounter Prescriptions as of 03/16/2013  Medication Sig Dispense Refill  . calcium carbonate (OS-CAL) 600 MG TABS Take 600 mg by mouth 2 (two) times daily with a meal.        . citalopram (CELEXA) 20 MG tablet Take 20 mg by mouth daily.        . Cyanocobalamin (B-12) 2000 MCG TABS Take 1 tablet by mouth daily.      Marland Kitchen donepezil (ARICEPT) 10 MG tablet Take 10 mg by mouth at bedtime as needed.        . ferrous sulfate 325 (65 FE) MG tablet Take 325 mg by mouth daily.      Marland Kitchen letrozole (FEMARA) 2.5 MG tablet Take 1 tablet by mouth Daily.      Marland Kitchen levothyroxine (SYNTHROID, LEVOTHROID) 25 MCG tablet Take 25 mcg by mouth daily.        . memantine (NAMENDA TITRATION PACK) tablet pack Take 5 mg by mouth daily.       . metoprolol succinate (TOPROL-XL) 50 MG 24 hr tablet take 1 tablet by mouth once daily  30 tablet  11  . potassium chloride SA (KLOR-CON M20) 20 MEQ tablet take 1 tablet by mouth twice a day  180 tablet  2  . simvastatin (ZOCOR) 20 MG tablet Take 20 mg by mouth at bedtime.        Marland Kitchen tiotropium (SPIRIVA) 18 MCG inhalation capsule Place 18 mcg into inhaler and inhale daily.         Marland Kitchen torsemide (DEMADEX) 20 MG tablet Take 3 tablets (60 mg) in the  morning and 2 tablets (40 mg) in the evening  150 tablet  3  . valsartan (DIOVAN) 80 MG tablet Take 0.5 tablets (40 mg total) by mouth daily.  90 tablet  3  . warfarin (COUMADIN) 2.5 MG tablet take 1 tablet by mouth as directed  180 tablet  0   No facility-administered encounter medications on file as of 03/16/2013.  :  Review of Systems:  Out of a complete 14 point review of systems, all are reviewed and negative with the exception of these symptoms as listed below:  Review of Systems  HENT: Positive for hearing loss.   Respiratory: Positive for shortness of breath and wheezing.   Neurological:       Memory Loss Confusion  Psychiatric/Behavioral: Positive for sleep disturbance (sleep insomnia).    Objective:  Neurologic Exam  Physical Exam Physical Examination:   Filed Vitals:   03/16/13 1322  BP: 106/55  Pulse: 59  Temp: 97.9 F (36.6 C)    General Examination: The patient is a very pleasant 79 y.o. female in no acute distress. She is calm and cooperative with the exam. She denies Auditory Hallucinations and Visual Hallucinations. She is adequately groomed and situated in a chair.   HEENT: Normocephalic, atraumatic, pupils are equal, round and reactive to light and accommodation. Extraocular tracking shows mild saccadic breakdown without nystagmus noted. Hearing is impaired. Face is symmetric with no facial masking and normal facial sensation. There is no lip, neck or jaw tremor. Neck is not rigid with intact passive ROM. There are no carotid bruits on auscultation. Oropharynx exam reveals moderate mouth dryness. No significant airway crowding is noted. Mallampati is class II. Tongue protrudes centrally and palate elevates symmetrically.    Chest: is clear to auscultation without wheezing, rhonchi or crackles noted.  Heart: sounds are regular with a 3/6 holosystolic murmur, no rubs or gallops noted.    Abdomen: is soft, non-tender and non-distended with normal bowel sounds appreciated on auscultation.  Extremities: There is 3+ pitting edema in the lower extremities bilaterally. Pedal pulses are difficult to palpate.   Skin: is warm and dry with no trophic changes noted. Age-related changes are noted on the skin. Multiple small bruises of varying ages are noted on the forearms. Of note, She is on coumadin.  Musculoskeletal: exam reveals no obvious joint deformities, tenderness or joint swelling or erythema. Changes consistent with OA of the hands are noted bilaterally.   Neurologically:  Mental status: The patient is awake and alert, paying good  attention. She is unable to completely provide the history. Her daughter provides the entire Hx. She is oriented to: person, place and situation. Her memory, attention, language and knowledge are impaired. There is no aphasia, agnosia, apraxia or anomia. There is a mild degree of bradyphrenia. Speech is not hypophonic with no dysarthria noted. Mood is congruent and affect is normal.  Her MMSE (Mini-Mental state exam) score is 20/30.  CDT (Clock Drawing Robertson) score is 4/4.  AFT (Animal Fluency Robertson) score is 6.   Cranial nerves are as described above under HEENT exam. In addition, shoulder shrug is normal with equal shoulder height noted.  Motor exam: Normal bulk, and strength for age is noted. Tone is not rigid with absence of cogwheeling in the extremities. There is overall mild bradykinesia. There is no drift or rebound. There is no tremor.   Romberg is negative. Reflexes are 1+ in the upper extremities and trace in the lower extremities. Fine motor skills: Finger taps, hand  movements, and rapid alternating patting are mildly impaired bilaterally. Foot taps and foot agility are mildly impaired bilaterally.   Cerebellar testing shows no dysmetria or intention tremor on finger to nose testing. Heel to shin is unremarkable. There is no truncal or gait  ataxia.   Sensory exam is intact to light touch, pinprick, vibration, temperature sense and proprioception in the upper and lower extremities.   Gait, station and balance: She stands up from the seated position with mild difficulty and needs no assistance. No veering to one side is noted. No leaning to one side. Posture is mildly stooped, she has scoliosis. Stance is wide-based. She is able to walk independently and turns in 3 steps. Tandem walk is not possible. Balance is mildly impaired.   Assessment and Plan:   In summary, Brianna Robertson is a very pleasant 79 y.o.-year old female with an underlying medical history of heart d/s, lung d/s, scoliosis, depression, and a 6 year hx of memory loss, lately with some mood related and behavioral issues. Her history and physical exam are keeping with advanced dementia. She has RF for vascular dementia. She has been on Aricept and low dose Namenda. I would like to increase this if possible. To that end, I will change her to Namenda XR 7 mg for 30 days and have asked Brianna Robertson to call in one month for an update, at which time we can go to 14 mg daily.  I had a long chat with the patient and her daughter about my findings and the diagnosis of advanced memory loss and dementia, its prognosis and treatment options. Implications of the diagnosis were explained at length with the patient and caregiver. We talked about medical treatments and non-pharmacological approaches. We talked about maintaining a healthy lifestyle in general and staying active mentally and physically. I encouraged the patient to eat healthy, exercise daily and keep well hydrated, to keep a scheduled bedtime and wake time routine, to not skip any meals and eat healthy snacks in between meals and to have protein with every meal. I stressed the importance of regular exercise, within of course the patient's own mobility limitations. I encouraged the patient to keep up with current events by reading the  news paper or watching the news and to do word puzzles, or if feasible, to go on [email or web address].   As far as further diagnostic testing is concerned, I suggested the following: MRI brain without contrast, and blood work.  As far as medications are concerned, I recommended the following at this time: continue Aricept. Increase Namenda to Namenda XR to 7 mg daily and then to 14 mg once daily. I provided her with a 30 day free trial Rx.  I answered all their questions today and the patient and her daughter were in agreement with the above outlined plan. I would like to see the patient back in 3 months, sooner if the need arises and encouraged them to call with any interim questions, concerns, problems, updates and refill requests and/or Robertson results.   Thank you very much for allowing me to participate in the care of this nice patient. If I can be of any further assistance to you please do not hesitate to call me at [phone].  Sincerely,   Brianna Foley, MD, PhD

## 2013-03-17 ENCOUNTER — Encounter: Payer: Self-pay | Admitting: *Deleted

## 2013-03-17 LAB — RPR: RPR: NONREACTIVE

## 2013-03-17 LAB — SEDIMENTATION RATE: Sed Rate: 7 mm/hr (ref 0–40)

## 2013-03-17 LAB — TSH: TSH: 1.27 u[IU]/mL (ref 0.450–4.500)

## 2013-03-17 NOTE — Progress Notes (Signed)
Quick Note:  Please advise patient or her caretaker that her blood work was normal. We checked an inflammatory markers, infectious marker, a liver marker as well as a thyroid function marker, all of which were reported as within normal limits. No further action is required on this test at this time. Please remind patient to keep any upcoming appointments or tests and to call us with any interim questions, concerns, problems or updates. Thanks,  Huston Foley, MD, PhD    ______

## 2013-03-17 NOTE — Progress Notes (Signed)
Quick Note:  I called and gave normal results of labs to [facility], daughter of pt. She is not on MyChart yet. Wanted copy mailed to address and also faxed to Dr, Mosetta Putt, who referred her to [place]. Done. ______

## 2013-03-26 ENCOUNTER — Other Ambulatory Visit: Payer: Self-pay | Admitting: *Deleted

## 2013-03-26 DIAGNOSIS — I712 Thoracic aortic aneurysm, without rupture: Secondary | ICD-10-CM

## 2013-03-29 ENCOUNTER — Ambulatory Visit: Payer: Medicare Other | Admitting: Cardiology

## 2013-04-05 ENCOUNTER — Ambulatory Visit
Admission: RE | Admit: 2013-04-05 | Discharge: 2013-04-05 | Disposition: A | Discharge status: Home / Self Care | Payer: Medicare Other | Source: Ambulatory Visit | Attending: Neurology | Admitting: Neurology

## 2013-04-05 DIAGNOSIS — R413 Other amnesia: Secondary | ICD-10-CM

## 2013-04-05 DIAGNOSIS — F03918 Unspecified dementia, unspecified severity, with other behavioral disturbance: Secondary | ICD-10-CM

## 2013-04-05 DIAGNOSIS — F32A Depression, unspecified: Secondary | ICD-10-CM

## 2013-04-05 DIAGNOSIS — F0391 Unspecified dementia with behavioral disturbance: Secondary | ICD-10-CM

## 2013-04-05 DIAGNOSIS — I5042 Chronic combined systolic (congestive) and diastolic (congestive) heart failure: Secondary | ICD-10-CM

## 2013-04-05 DIAGNOSIS — F329 Major depressive disorder, single episode, unspecified: Secondary | ICD-10-CM

## 2013-04-05 DIAGNOSIS — J449 Chronic obstructive pulmonary disease, unspecified: Secondary | ICD-10-CM

## 2013-04-07 NOTE — Progress Notes (Signed)
Quick Note:  Please call patient regarding the recent brain MRI: The brain scan showed a normal structure of the brain and mild to moderate volume loss which we call atrophy. There were changes in the deeper structures of the brain, which we call white matter changes or microvascular changes. These were reported as mild in Her case. These are tiny white spots, that occur with time and are seen in a variety of conditions, including with normal aging, chronic hypertension, chronic headaches, especially migraine HAs, chronic diabetes, chronic hyperlipidemia. These are not strokes and no mass or lesion was seen which is reassuring. Again, there were no acute findings, such as a stroke, or mass or blood products. In fact when comparing findings with an MRI from 11/03/2009 the reading Dr. felt there was no significant change.  No further action is required on this test at this time, other than re-enforcing the importance of good blood pressure control, good cholesterol control, good blood sugar control, and weight management. Please remind patient to keep any upcoming appointments or tests and to call us with any interim questions, concerns, problems or updates. Thanks,  Huston Foley, MD, PhD    ______

## 2013-04-12 ENCOUNTER — Other Ambulatory Visit: Payer: Self-pay | Admitting: Neurology

## 2013-04-12 DIAGNOSIS — F0391 Unspecified dementia with behavioral disturbance: Secondary | ICD-10-CM

## 2013-04-12 DIAGNOSIS — I5042 Chronic combined systolic (congestive) and diastolic (congestive) heart failure: Secondary | ICD-10-CM

## 2013-04-12 DIAGNOSIS — J449 Chronic obstructive pulmonary disease, unspecified: Secondary | ICD-10-CM

## 2013-04-12 DIAGNOSIS — F329 Major depressive disorder, single episode, unspecified: Secondary | ICD-10-CM

## 2013-04-12 MED ORDER — MEMANTINE HCL ER 14 MG PO CP24: 14.0000 mg | ORAL_CAPSULE | Freq: Every day | ORAL | Status: AC

## 2013-04-13 ENCOUNTER — Ambulatory Visit (INDEPENDENT_AMBULATORY_CARE_PROVIDER_SITE_OTHER): Payer: Medicare Other | Admitting: *Deleted

## 2013-04-13 DIAGNOSIS — I4891 Unspecified atrial fibrillation: Secondary | ICD-10-CM

## 2013-04-13 DIAGNOSIS — Z7901 Long term (current) use of anticoagulants: Secondary | ICD-10-CM

## 2013-04-20 ENCOUNTER — Other Ambulatory Visit: Payer: Medicare Other

## 2013-04-20 ENCOUNTER — Encounter: Payer: Medicare Other | Admitting: Thoracic Surgery (Cardiothoracic Vascular Surgery)

## 2013-04-21 ENCOUNTER — Other Ambulatory Visit: Payer: Self-pay | Admitting: Cardiology

## 2013-05-11 ENCOUNTER — Ambulatory Visit (INDEPENDENT_AMBULATORY_CARE_PROVIDER_SITE_OTHER): Payer: Medicare Other | Admitting: Thoracic Surgery (Cardiothoracic Vascular Surgery)

## 2013-05-11 ENCOUNTER — Encounter: Payer: Self-pay | Admitting: Thoracic Surgery (Cardiothoracic Vascular Surgery)

## 2013-05-11 ENCOUNTER — Ambulatory Visit (INDEPENDENT_AMBULATORY_CARE_PROVIDER_SITE_OTHER): Payer: Medicare Other | Admitting: Pharmacist

## 2013-05-11 ENCOUNTER — Ambulatory Visit
Admission: RE | Admit: 2013-05-11 | Discharge: 2013-05-11 | Disposition: A | Discharge status: Home / Self Care | Payer: Medicare Other | Source: Ambulatory Visit | Attending: Thoracic Surgery (Cardiothoracic Vascular Surgery) | Admitting: Thoracic Surgery (Cardiothoracic Vascular Surgery)

## 2013-05-11 VITALS — BP 97/67 | HR 65 | Resp 20 | Ht 63.0 in | Wt 161.0 lb

## 2013-05-11 DIAGNOSIS — I712 Thoracic aortic aneurysm, without rupture: Secondary | ICD-10-CM

## 2013-05-11 DIAGNOSIS — R918 Other nonspecific abnormal finding of lung field: Secondary | ICD-10-CM

## 2013-05-11 DIAGNOSIS — I4891 Unspecified atrial fibrillation: Secondary | ICD-10-CM

## 2013-05-11 DIAGNOSIS — Z7901 Long term (current) use of anticoagulants: Secondary | ICD-10-CM

## 2013-05-11 LAB — BUN: BUN: 17 mg/dL (ref 6–23)

## 2013-05-11 LAB — POCT INR: INR: 2.8

## 2013-05-11 MED ORDER — IOHEXOL 350 MG/ML SOLN
60.0000 mL | Freq: Once | INTRAVENOUS | Status: AC | PRN
  Administered 2013-05-11: 60 mL via INTRAVENOUS

## 2013-05-11 MED ORDER — IOHEXOL 350 MG/ML SOLN: 80.0000 mL | Freq: Once | INTRAVENOUS | Status: DC | PRN

## 2013-05-11 NOTE — Progress Notes (Signed)
HPI:  Brianna Robertson returns today for one year followup. She is an 79 year old woman with multiple medical problems including dementia who has a known 4.3 cm ascending aortic aneurysm as well as multiple lung nodules. Dr. Maple Hudson has been following her lung nodules for many years and they've been stable to relatively slow growing. She was found on CT scan to have a 4.3 cm ascending aneurysm 2 years ago. I have been following her since then.  She has dementia and according to her daughter her memory problems are severe and have gotten worse recently. She has not been having any chest pain, but does get short of breath with exertion.  Past Medical History  Diagnosis Date  . Breast cancer 1995    bilateral  . HTN (hypertension)   . Hypothyroidism   . Atrial fibrillation 08/2005  . Dementia     moderate  . CKD (chronic kidney disease)     mild  . NICM (nonischemic cardiomyopathy)     a. possibly tachycardia-mediated;  b. 09/2011 Echo: EF 45-50%  . Hyperlipemia   . Pulmonary nodule 06/2006  . LBBB (left bundle branch block)   . Aortic stenosis     a. 09/2011 Echo: Mild AS, severely Ca2+ leaflets.  . Mitral regurgitation     a. 09/2011 Echo:  Mild-moderate (previously read as mod-sev).  . COPD (chronic obstructive pulmonary disease)   . Thoracic aortic aneurysm   . Depression   . Chronic systolic CHF (congestive heart failure)     a. 09/2011 Echo: EF 45-50%  . Scoliosis   . History of pneumonia 2010  . Insomnia   . Rotator cuff disorder     right  . Hearing loss   . Vitamin D deficiency   . History of transfusion of whole blood      Current Outpatient Prescriptions  Medication Sig Dispense Refill  . calcium carbonate (OS-CAL) 600 MG TABS Take 600 mg by mouth 2 (two) times daily with a meal.        . citalopram (CELEXA) 20 MG tablet Take 20 mg by mouth daily.        . Cyanocobalamin (B-12) 2000 MCG TABS Take 1 tablet by mouth daily.      Marland Kitchen donepezil (ARICEPT) 10 MG tablet Take 10 mg by  mouth at bedtime as needed.        . ferrous sulfate 325 (65 FE) MG tablet Take 325 mg by mouth daily.      Marland Kitchen letrozole (FEMARA) 2.5 MG tablet Take 1 tablet by mouth Daily.      Marland Kitchen levothyroxine (SYNTHROID, LEVOTHROID) 25 MCG tablet Take 25 mcg by mouth daily.        . Memantine HCl ER (NAMENDA XR) 14 MG CP24 Take 1 tablet (14 mg total) by mouth daily.  30 capsule  5  . metoprolol succinate (TOPROL-XL) 50 MG 24 hr tablet take 1 tablet by mouth once daily  30 tablet  11  . potassium chloride SA (KLOR-CON M20) 20 MEQ tablet take 1 tablet by mouth twice a day  180 tablet  2  . simvastatin (ZOCOR) 20 MG tablet Take 20 mg by mouth at bedtime.        Marland Kitchen tiotropium (SPIRIVA) 18 MCG inhalation capsule Place 18 mcg into inhaler and inhale daily.        Marland Kitchen torsemide (DEMADEX) 20 MG tablet take 3 tablets by mouth every morning and 2 tablets every evening  150 tablet  1  . valsartan (  DIOVAN) 80 MG tablet Take 0.5 tablets (40 mg total) by mouth daily.  90 tablet  3  . warfarin (COUMADIN) 2.5 MG tablet take 1 tablet by mouth as directed  180 tablet  0   No current facility-administered medications for this visit.    Physical Exam BP 97/67  Pulse 65  Resp 20  Ht 5\' 3"  (1.6 m)  Wt 161 lb (73.029 kg)  BMI 28.53 kg/m2  SpO50 52% 79 year old woman in no acute distress Alert, does not follow conversation appropriately Cardiac regular rate and rhythm with a 3/6 systolic murmur right upper sternal border Lungs clear Carotids positive bruits versus transmitted murmur bilaterally No cervical or supraclavicular adenopathy  Diagnostic Tests: CT angiogram of chest 05/11/2013 IMPRESSION: 1. Ectasia of the ascending thoracic aorta redemonstrated, with an average diameter of approximately 4.4 cm. This is essentially stable compared to the prior study. 2. Slight interval enlargement of pulmonary nodules in the inferior segment of the lingula and lateral segment of the right middle lobe. Other pulmonary nodules are  otherwise stable in size and number. Notably, the lingular nodule has slowly grown since 2007, at which time this nodule demonstrated hypermetabolism on the PET-CT. 3. Additional incidental findings, as above.  Impression: 79 year old woman with a 4.4 cm ascending aortic aneurysm. This is possibly slightly larger than it was a year ago. It does not meet size criteria for surgical resection. And truth be told, she would not be a candidate for surgical repair anyway.  The same holds true for the lung nodules. The left lingular nodule has increased in size by approximately 1 mm of the past year. However in my opinion she is not a candidate for any type of aggressive workup or treatment of that nodule. It is such a slow-growing lesion that she is more likely to die of other causes than she is the nodule. She is not a surgical candidate by any stretch of the imagination.  My recommendation is to continue to follow Brianna Robertson. I recommended that we repeat a CT angiogram in one year. However, I did discuss with her daughter that since she really is not a candidate for any type of aggressive treatment, it would be reasonable to consider not continuing to follow her for these issues. She will take that into consideration.

## 2013-06-01 ENCOUNTER — Ambulatory Visit (INDEPENDENT_AMBULATORY_CARE_PROVIDER_SITE_OTHER): Payer: Medicare Other | Admitting: Cardiology

## 2013-06-01 ENCOUNTER — Ambulatory Visit (INDEPENDENT_AMBULATORY_CARE_PROVIDER_SITE_OTHER): Payer: Medicare Other | Admitting: *Deleted

## 2013-06-01 ENCOUNTER — Encounter: Payer: Self-pay | Admitting: Cardiology

## 2013-06-01 ENCOUNTER — Ambulatory Visit (HOSPITAL_COMMUNITY): Payer: Medicare Other | Attending: Cardiology

## 2013-06-01 VITALS — BP 108/64 | HR 51 | Ht 63.0 in | Wt 160.0 lb

## 2013-06-01 DIAGNOSIS — I6529 Occlusion and stenosis of unspecified carotid artery: Secondary | ICD-10-CM

## 2013-06-01 DIAGNOSIS — G309 Alzheimer's disease, unspecified: Secondary | ICD-10-CM

## 2013-06-01 DIAGNOSIS — E785 Hyperlipidemia, unspecified: Secondary | ICD-10-CM | POA: Insufficient documentation

## 2013-06-01 DIAGNOSIS — I5022 Chronic systolic (congestive) heart failure: Secondary | ICD-10-CM

## 2013-06-01 DIAGNOSIS — I4891 Unspecified atrial fibrillation: Secondary | ICD-10-CM

## 2013-06-01 DIAGNOSIS — J4489 Other specified chronic obstructive pulmonary disease: Secondary | ICD-10-CM | POA: Insufficient documentation

## 2013-06-01 DIAGNOSIS — I712 Thoracic aortic aneurysm, without rupture, unspecified: Secondary | ICD-10-CM

## 2013-06-01 DIAGNOSIS — I10 Essential (primary) hypertension: Secondary | ICD-10-CM | POA: Insufficient documentation

## 2013-06-01 DIAGNOSIS — I7121 Aneurysm of the ascending aorta, without rupture: Secondary | ICD-10-CM

## 2013-06-01 DIAGNOSIS — Z7901 Long term (current) use of anticoagulants: Secondary | ICD-10-CM

## 2013-06-01 DIAGNOSIS — R0602 Shortness of breath: Secondary | ICD-10-CM

## 2013-06-01 DIAGNOSIS — J449 Chronic obstructive pulmonary disease, unspecified: Secondary | ICD-10-CM | POA: Insufficient documentation

## 2013-06-01 DIAGNOSIS — F0280 Dementia in other diseases classified elsewhere without behavioral disturbance: Secondary | ICD-10-CM

## 2013-06-01 DIAGNOSIS — I2510 Atherosclerotic heart disease of native coronary artery without angina pectoris: Secondary | ICD-10-CM | POA: Insufficient documentation

## 2013-06-01 DIAGNOSIS — I658 Occlusion and stenosis of other precerebral arteries: Secondary | ICD-10-CM | POA: Insufficient documentation

## 2013-06-01 LAB — BASIC METABOLIC PANEL
BUN: 18 mg/dL (ref 6–23)
CO2: 37 mEq/L — ABNORMAL HIGH (ref 19–32)
Calcium: 9.8 mg/dL (ref 8.4–10.5)
Chloride: 97 mEq/L (ref 96–112)
Creatinine, Ser: 1.4 mg/dL — ABNORMAL HIGH (ref 0.4–1.2)
GFR: 36.7 mL/min — ABNORMAL LOW (ref >60.00)
GLUCOSE: 82 mg/dL (ref 70–99)
POTASSIUM: 4.4 meq/L (ref 3.5–5.1)
Sodium: 141 mEq/L (ref 135–145)

## 2013-06-01 LAB — BRAIN NATRIURETIC PEPTIDE: Pro B Natriuretic peptide (BNP): 155 pg/mL — ABNORMAL HIGH (ref 0.0–100.0)

## 2013-06-01 LAB — POCT INR: INR: 3.8

## 2013-06-01 MED ORDER — WARFARIN SODIUM 2.5 MG PO TABS: 2.5000 mg | ORAL_TABLET | ORAL | Status: AC

## 2013-06-01 MED ORDER — POTASSIUM CHLORIDE CRYS ER 20 MEQ PO TBCR: EXTENDED_RELEASE_TABLET | ORAL | Status: DC

## 2013-06-01 MED ORDER — METOPROLOL SUCCINATE ER 50 MG PO TB24: ORAL_TABLET | ORAL | Status: DC

## 2013-06-01 MED ORDER — TORSEMIDE 20 MG PO TABS: ORAL_TABLET | ORAL | Status: DC

## 2013-06-01 NOTE — Patient Instructions (Addendum)
Your physician recommends that you have lab work today--BMET/BNP.  Your physician wants you to follow-up in: 4 months with Dr Aundra Dubin. (May  2015).  You will receive a reminder letter in the mail two months in advance. If you don't receive a letter, please call our office to schedule the follow-up appointment.

## 2013-06-01 NOTE — Progress Notes (Signed)
Patient ID: Brianna Robertson, female   DOB: 1926-05-27, 80 y.o.   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  She has a history of dementia, atrial fibrillation, and possible tachycardia-mediated cardiomyopathy.  She went back into atrial fibrillation with rapid ventricular response in 12/11 and was hospitalized in [place]. Myoview showed no ischemia. Echo showed EF 20% with global hypokinesis. She was diuresed and rate was controlled. She was started on coumadin. No bleeding complications since starting coumadin. Repeat echo in 2/12 with rate control showed some improvement in EF, 35-40%. There was moderate to severe mitral regurgitation and mild aortic stenosis.   Most recent echo in 5/13 showed EF 45-50% with mild AS and mild to moderate MR.  She has been in NSR.   She has stable dyspnea after walking 50-60 feet and has to stop.  She uses her walker.  No falls but a bit unsteady on her feet.  No chest pain.  No orthopnea/PND. Weight is up 1 lb since last appointment.   ECG: NSR, LBBB  Labs (2/13): K 4.6, creatinine 1.3, BNP 501 => 325 Labs (3/13): K 4, creatinine 1.3 Labs (8/13): K 4.1, creatinine 1.4, BNP 217 Labs (9/13): KL 4.1, creatinine 1.4, BNP 217, LDL 56, HDl 64 Labs (11/13): K 4.1, creatinine 1.5, BNP 118 Labs (12/14): creatinine 1.4  Past Medical History:  1. Breast cancer, hx of 1995. S/P lumpectomy and radiation (node, ER, PR negative).  2. Hypertension  3. Hypothyroidism  4. 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 Patient was cardioverted and maintained on amiodarone, and EF improved to 40%. Patient stopped amiodarone because of anorexia and stopped coumadin due to rectus sheath hematoma. Atrial fib with RVR recurred in 12/11. EF down to 20% on echo. Patient was rate-controlled and coumadin was restarted.  Recently, she has been back in NSR.  5. Moderate  dementia  6. CKD: mild  7. Cardiomyopathy: Possibly tachycardia-mediated. In 4/07 in setting of atrial fibrillation with RVR, EF was 30%. After conversion to NSR, EF 40% in 8/09. In 12/11, noted to be back in atrial fibrillation with RVR. Echo (12/11) with EF 20%, severe global hypokinesis, mild to moderate MR, PA systolic pressure 39 mmHg, mild to moderate AS with mean gradient of 13 (may be low gradient AS in the setting of decreased cardiac output). Myoview in 4/07 was negative for ischemia or infarction. Myoview (12/11) showed small fixed apical defect likely due to apical thinning. No ischemia. Echo (2/12): EF 35% with diffuse hypokinesis (worse anteroseptally), mild AS (mean gradient 16 mmHg), mild AI, moderate to severe MR, moderate biatrial enlargement, normal RV, PA systolic pressure 40 mmHg.  Echo (5/13): EF 45-50%, mild AS, mild to moderate MR, PA systolic pressure 52 mmHg.  7. Hyperlipidemia  8. Pulmonary Nodule 9. Aortic stenosis: mild.  10. MItral regurgitation: moderate to severe on 2/12 echo, mild to moderate on 5/13 echo.  11. Asthma 12. Carotid dopplers (9/12): 0-39% bilateral ICA stenosis.  13. Ascending aortic aneurysm: CTA chest 11/13 with 4.4 cm ascending aortic aneurysm (stable compared to prior).  CTA chest 12/14 with 4.4 cm ascending aortic aneurysm.    Current Outpatient Prescriptions  Medication Sig Dispense Refill  . calcium carbonate (OS-CAL) 600 MG TABS Take 600 mg by mouth 2 (two) times daily with a meal.        . citalopram (CELEXA) 20 MG tablet Take 20 mg by mouth daily.        Marland Kitchen  Cyanocobalamin (B-12) 2000 MCG TABS Take 1 tablet by mouth daily.      Marland Kitchen donepezil (ARICEPT) 10 MG tablet Take 10 mg by mouth at bedtime as needed.        . ferrous sulfate 325 (65 FE) MG tablet Take 325 mg by mouth daily.      Marland Kitchen letrozole (FEMARA) 2.5 MG tablet Take 1 tablet by mouth Daily.      Marland Kitchen levothyroxine (SYNTHROID, LEVOTHROID) 25 MCG tablet Take 25 mcg by mouth daily.        .  Memantine HCl ER (NAMENDA XR) 14 MG CP24 Take 1 tablet (14 mg total) by mouth daily.  30 capsule  5  . metoprolol succinate (TOPROL-XL) 50 MG 24 hr tablet take 1 tablet by mouth once daily  90 tablet  3  . potassium chloride SA (KLOR-CON M20) 20 MEQ tablet take 1 tablet by mouth twice a day  180 tablet  3  . simvastatin (ZOCOR) 20 MG tablet Take 20 mg by mouth at bedtime.        Marland Kitchen tiotropium (SPIRIVA) 18 MCG inhalation capsule Place 18 mcg into inhaler and inhale daily.        Marland Kitchen torsemide (DEMADEX) 20 MG tablet take 3 tablets by mouth every morning and 2 tablets every evening  450 tablet  3  . valsartan (DIOVAN) 80 MG tablet Take 0.5 tablets (40 mg total) by mouth daily.  90 tablet  3  . warfarin (COUMADIN) 2.5 MG tablet Take 1 tablet (2.5 mg total) by mouth as directed.  180 tablet  1   No current facility-administered medications for this visit.    Allergies: Allergies  Allergen Reactions  . Lasix [Furosemide]     Discontinued by Dr Sandi Mariscal  . Penicillins   . Sulfonamide Derivatives     History  Substance Use Topics  . Smoking status: Never Smoker   . Smokeless tobacco: Never Used  . Alcohol Use: No     Comment: very infrequently    Family History:  Family hx of heart disease,cancer, and arthritis.  Brother- pancreatic cancer  PHYSICAL EXAM: VS:  BP 108/64  Pulse 51  Ht 5\' 3"  (1.6 m)  Wt 72.576 kg (160 lb)  BMI 28.35 kg/m2  Well nourished, well developed, in no acute distress HEENT: normal Neck: JVP not elevated Cardiac:  S1, S2, regular; 2/6 systolic murmur RUSB/apex.  Lungs:  Clear bilaterally.  Abd: soft, nontender  Ext: Trace ankle edema bilaterally.  Skin: warm and dry Neuro: Alert, oriented, memory is deficient Psych: pleasant   Assessment/Plan:  ATRIAL FIBRILLATION  In NSR today. Continue coumadin. No recent falls. Continue Toprol XL. Chronic systolic heart failure   EF improved to 45-50% on last echo. I think that she has had a tachycardia-mediated  cardiomyopathy with LV function improved now that she has been back in NSR. Continue current doses of Toprol XL and valsartan. She continues to have stable NYHA class III symptoms.  Volume status ok, continue torsemide 60 qam, 40 qpm.  BMET/BNP today. I would like to see her a bit more active.  Mitral regurgitation Seems to have improved, only mild-moderate on most recent echo while in NSR. Atrial fibrillation can certainly lead to worsening MR so improvement is not unexpected with resumption of NSR.  CKD Follow creatinine closely, BMET today.   Ascending aortic aneurysm Stable at 4.4 cm in 12/14.  She would not be a good surgical candidate and aneurysm has been stable.  I do not  think she needs further evaluation of this.   Loralie Champagne 06/01/2013

## 2013-06-02 ENCOUNTER — Other Ambulatory Visit: Payer: Self-pay | Admitting: *Deleted

## 2013-06-02 DIAGNOSIS — I4891 Unspecified atrial fibrillation: Secondary | ICD-10-CM

## 2013-06-02 NOTE — Addendum Note (Signed)
Addended by: Katrine Coho on: 06/02/2013 09:01 AM   Modules accepted: Orders

## 2013-06-14 ENCOUNTER — Ambulatory Visit (INDEPENDENT_AMBULATORY_CARE_PROVIDER_SITE_OTHER): Payer: Medicare Other | Admitting: Pharmacist

## 2013-06-14 DIAGNOSIS — I4891 Unspecified atrial fibrillation: Secondary | ICD-10-CM

## 2013-06-14 DIAGNOSIS — Z7901 Long term (current) use of anticoagulants: Secondary | ICD-10-CM

## 2013-06-14 LAB — POCT INR: INR: 4.8

## 2013-06-28 ENCOUNTER — Ambulatory Visit (INDEPENDENT_AMBULATORY_CARE_PROVIDER_SITE_OTHER): Payer: Medicare Other | Admitting: Pharmacist

## 2013-06-28 DIAGNOSIS — Z7901 Long term (current) use of anticoagulants: Secondary | ICD-10-CM

## 2013-06-28 DIAGNOSIS — I4891 Unspecified atrial fibrillation: Secondary | ICD-10-CM

## 2013-06-28 LAB — BASIC METABOLIC PANEL
BUN: 19 mg/dL (ref 6–23)
CO2: 38 mEq/L — ABNORMAL HIGH (ref 19–32)
Calcium: 9.4 mg/dL (ref 8.4–10.5)
Chloride: 95 mEq/L — ABNORMAL LOW (ref 96–112)
Creatinine, Ser: 1.5 mg/dL — ABNORMAL HIGH (ref 0.4–1.2)
GFR: 33.96 mL/min — ABNORMAL LOW (ref >60.00)
Glucose, Bld: 97 mg/dL (ref 70–99)
POTASSIUM: 4 meq/L (ref 3.5–5.1)
SODIUM: 138 meq/L (ref 135–145)

## 2013-06-28 LAB — POCT INR: INR: 3.9

## 2013-06-29 ENCOUNTER — Telehealth: Payer: Self-pay | Admitting: *Deleted

## 2013-06-29 ENCOUNTER — Telehealth: Payer: Self-pay | Admitting: Cardiology

## 2013-06-29 DIAGNOSIS — I5022 Chronic systolic (congestive) heart failure: Secondary | ICD-10-CM

## 2013-06-29 DIAGNOSIS — R0602 Shortness of breath: Secondary | ICD-10-CM

## 2013-06-29 NOTE — Telephone Encounter (Signed)
msg left/ Dr Aundra Dubin and Webb Silversmith RN will be back tomorrow and I will forward msg/ asked her to call with further questions or concerns.

## 2013-06-29 NOTE — Telephone Encounter (Signed)
Message copied by MUSE, Shan Levans on Tue Jun 29, 2013  2:11 PM ------      Message from: Larey Dresser      Created: Mon Jun 28, 2013  5:34 PM       She may change to 60 mg bid with BMET in 1 week and followup with me soon.       ----- Message -----         From: Zenovia Jarred, RN         Sent: 06/28/2013   3:26 PM           To: Larey Dresser, MD, Katrine Coho, RN            Pt seen in CVRR today. Pt complaining of SOB with ambulation and simple activities. Weight today 166 lbs at last OV with you in January she was 160lbs. Lower extremities very swollen and  noted with 1+ pitting edema. Per Porfirio Oar, Pharm D she suggested increased her Demadex to 3 tabs BID. Presently she is taking 3 tabs in AM and 2 tabs in PM. BMET drawn today and we will recheck in 2 weeks again at her next CVRR appt. We encouraged pt to drink 1.5 liters of fluid daily. Elevate legs frequently and monitor sodium intake. All instructions given to daughter. Please advise on Demadex dose and other instructions.  Thank you.        ------

## 2013-06-29 NOTE — Telephone Encounter (Signed)
Daughter, [name] is aware to continue Demadex 60mg s BID and that Dr Aundra Dubin wants repeat labs in one week and wants to see her soon. That Dr Oleh Genin nurse will call her for this follow.

## 2013-06-29 NOTE — Telephone Encounter (Signed)
New message        Pt daughter calling regarding her change in lasix and wants to know more about her moms heart condition.

## 2013-06-30 NOTE — Telephone Encounter (Signed)
Pt's daughter,Lisa advised to have pt take torsemide 60mg  bid, BMET scheduled for 07/06/12, appt with Dr Aundra Dubin 07/15/13. I offered pt an appt with Dr Aundra Dubin 07/06/13 but daughter unable to bring pt at that time.

## 2013-06-30 NOTE — Telephone Encounter (Signed)
Message from: [facility]  Created: Mon Jun 28, 2013 5:34 PM  She may change to 60 mg bid with BMET in 1 week and followup with me soon.

## 2013-07-06 ENCOUNTER — Other Ambulatory Visit (INDEPENDENT_AMBULATORY_CARE_PROVIDER_SITE_OTHER): Payer: Medicare Other

## 2013-07-06 DIAGNOSIS — R0602 Shortness of breath: Secondary | ICD-10-CM

## 2013-07-06 DIAGNOSIS — I5022 Chronic systolic (congestive) heart failure: Secondary | ICD-10-CM

## 2013-07-06 LAB — BASIC METABOLIC PANEL
BUN: 21 mg/dL (ref 6–23)
CO2: 38 meq/L — AB (ref 19–32)
CREATININE: 1.6 mg/dL — AB (ref 0.4–1.2)
Calcium: 9.3 mg/dL (ref 8.4–10.5)
Chloride: 94 mEq/L — ABNORMAL LOW (ref 96–112)
GFR: 31.8 mL/min — ABNORMAL LOW (ref >60.00)
GLUCOSE: 133 mg/dL — AB (ref 70–99)
Potassium: 3.8 mEq/L (ref 3.5–5.1)
Sodium: 139 mEq/L (ref 135–145)

## 2013-07-09 ENCOUNTER — Telehealth: Payer: Self-pay | Admitting: Cardiology

## 2013-07-09 NOTE — Telephone Encounter (Signed)
New Problem:  Pt's daughter lives in [place] and wants to come to her mom's appt. She is requesting that her mom be worked in to see Dr Aundra Dubin on 3/2. Brianna Robertson would like a call back to know if her mom could be worked in... Brianna Robertson is a physical therapist and would like hear everything from her moms appt.

## 2013-07-09 NOTE — Telephone Encounter (Signed)
Left message for daughter that I will have Webb Silversmith look at the schedule for that day when she returns next week to reschedule this appointment for her.

## 2013-07-14 ENCOUNTER — Telehealth: Payer: Self-pay | Admitting: Neurology

## 2013-07-14 NOTE — Telephone Encounter (Signed)
Follow UP:  Brianna Robertson is requesting a call back from [place].

## 2013-07-14 NOTE — Telephone Encounter (Signed)
LM---no appts open on March 2 right now, have availability with Dr Aundra Dubin March 3 and would be glad to schedule pt an appt that day.

## 2013-07-14 NOTE — Telephone Encounter (Signed)
Patient's daughter calling to give some observations regarding patient before her scheduled appointment on 07/20/13 with Dr. Rexene Alberts. Patient's daughter noticed that patient has little to no short term memory, has lost the ability to entertain herself, appears happy but seems lonely since she calls her daughters at odd times such as at like 5:30 AM. Patient has a care provider and she gets angry with her when she tries to get her to do activities that she does not want to do. Please call patient's daughter for more information.

## 2013-07-15 ENCOUNTER — Ambulatory Visit: Payer: Medicare Other | Admitting: Cardiology

## 2013-07-16 NOTE — Telephone Encounter (Signed)
During reminder call Pt's daughter, Lattie Haw, inquired about her previous call to speak with Dr. Rexene Alberts.  She would prefer to discuss some of the issues 1 on 1 as she believes it will upset her mother.  Some additional questions she would like to discuss is what is her mother's official diagnosis, is it Dementia or is it Alzheimers?  At what point should they move PT into assisted living.  PT currently has a caregiver that is with her at least 8 hours a day, and she spends 4 hours in an adult "daycare" several days a week.  They are finding that most of the problems seem to be happening in the evening after the caregiver has gone home for the day.  Please call Lattie Haw to discuss.  Thank you!

## 2013-07-19 NOTE — Telephone Encounter (Signed)
Pt has been moved to Dr Claris Gladden 07/26/13 schedule.

## 2013-07-19 NOTE — Telephone Encounter (Signed)
Patient's daughter Lattie Haw would like to speak with Dr. Rexene Alberts concerning mother condition. Please call patient's daughter at [phone]. Daughter has questions for the Dr. Please advise

## 2013-07-20 ENCOUNTER — Ambulatory Visit: Payer: Medicare Other | Admitting: Neurology

## 2013-07-21 NOTE — Telephone Encounter (Signed)
I called and talked to the patient's daughter Lattie Haw at length on 07/19/2013. We talked about the patient's diagnosis of dementia. I told her daughter that it is sometimes difficult to discern between vascular dementia and Alzheimer's dementia. We talked about possibly transitioning the patient into assisted living. Her daughter has started looking into assisted living facilities in her area. She lives in [place]. The patient has another daughter who lives out of state. I suggested that we bring up the possibility of transitioning into an assisted living facility with the patient during her appointment. Her appointment was scheduled on 07/20/2013 but unfortunately had to reschedule this because of the weather. I had a fairly lengthy discussion with the daughter and answered all her questions.

## 2013-07-26 ENCOUNTER — Encounter (INDEPENDENT_AMBULATORY_CARE_PROVIDER_SITE_OTHER): Payer: Self-pay

## 2013-07-26 ENCOUNTER — Encounter: Payer: Self-pay | Admitting: *Deleted

## 2013-07-26 ENCOUNTER — Ambulatory Visit (INDEPENDENT_AMBULATORY_CARE_PROVIDER_SITE_OTHER): Payer: Medicare Other | Admitting: *Deleted

## 2013-07-26 ENCOUNTER — Encounter: Payer: Self-pay | Admitting: Cardiology

## 2013-07-26 ENCOUNTER — Ambulatory Visit (INDEPENDENT_AMBULATORY_CARE_PROVIDER_SITE_OTHER): Payer: Medicare Other | Admitting: Cardiology

## 2013-07-26 VITALS — BP 90/50 | HR 70 | Ht 67.0 in | Wt 163.0 lb

## 2013-07-26 DIAGNOSIS — I4891 Unspecified atrial fibrillation: Secondary | ICD-10-CM

## 2013-07-26 DIAGNOSIS — I6529 Occlusion and stenosis of unspecified carotid artery: Secondary | ICD-10-CM

## 2013-07-26 DIAGNOSIS — N259 Disorder resulting from impaired renal tubular function, unspecified: Secondary | ICD-10-CM

## 2013-07-26 DIAGNOSIS — I5023 Acute on chronic systolic (congestive) heart failure: Secondary | ICD-10-CM

## 2013-07-26 DIAGNOSIS — I5022 Chronic systolic (congestive) heart failure: Secondary | ICD-10-CM

## 2013-07-26 DIAGNOSIS — I059 Rheumatic mitral valve disease, unspecified: Secondary | ICD-10-CM

## 2013-07-26 DIAGNOSIS — Z7901 Long term (current) use of anticoagulants: Secondary | ICD-10-CM

## 2013-07-26 DIAGNOSIS — I340 Nonrheumatic mitral (valve) insufficiency: Secondary | ICD-10-CM

## 2013-07-26 LAB — POCT INR: INR: 2

## 2013-07-26 MED ORDER — TORSEMIDE 20 MG PO TABS: ORAL_TABLET | ORAL | Status: DC

## 2013-07-26 NOTE — Progress Notes (Signed)
Patient ID: Brianna Robertson, female   DOB: 16-Oct-1925, 80 y.o.   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  She has a history of dementia, atrial fibrillation, and possible tachycardia-mediated cardiomyopathy.  She went back into atrial fibrillation with rapid ventricular response in 12/11 and was hospitalized in [place]. Myoview showed no ischemia. Echo showed EF 20% with global hypokinesis. She was diuresed and rate was controlled. She was started on coumadin. No bleeding complications since starting coumadin. Repeat echo in 2/12 with rate control showed some improvement in EF, 35-40%. There was moderate to severe mitral regurgitation and mild aortic stenosis.   Most recent echo in 5/13 showed EF 45-50% with mild AS and mild to moderate MR.  She has been in NSR.   Brianna Robertson has had slowly progressive dyspnea.  She is now short of breath after walking about 25 feet.  Weight is up 3 lbs.  She uses her walker.  No falls but a bit unsteady on her feet.  No chest pain.  No orthopnea/PND. Her daughters are contemplating moving her into assisted living.    ECG: NSR, LBBB  Labs (2/13): K 4.6, creatinine 1.3, BNP 501 => 325 Labs (3/13): K 4, creatinine 1.3 Labs (8/13): K 4.1, creatinine 1.4, BNP 217 Labs (9/13): KL 4.1, creatinine 1.4, BNP 217, LDL 56, HDl 64 Labs (11/13): K 4.1, creatinine 1.5, BNP 118 Labs (12/14): creatinine 1.4 Labs (2/15): K 3.8, creatinine 1.6  Past Medical History:  1. Breast cancer, hx of 1995. S/P lumpectomy and radiation (node, ER, PR negative).  2. Hypertension  3. Hypothyroidism  4. 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 Patient was cardioverted and maintained on amiodarone, and EF improved to 40%. Patient stopped amiodarone because of anorexia and stopped coumadin due to rectus sheath hematoma. Atrial fib with RVR recurred in 12/11. EF down to  20% on echo. Patient was rate-controlled and coumadin was restarted.  Recently, she has been back in NSR.  5. Moderate dementia  6. CKD: mild  7. Cardiomyopathy: Possibly tachycardia-mediated. In 4/07 in setting of atrial fibrillation with RVR, EF was 30%. After conversion to NSR, EF 40% in 8/09. In 12/11, noted to be back in atrial fibrillation with RVR. Echo (12/11) with EF 20%, severe global hypokinesis, mild to moderate MR, PA systolic pressure 39 mmHg, mild to moderate AS with mean gradient of 13 (may be low gradient AS in the setting of decreased cardiac output). Myoview in 4/07 was negative for ischemia or infarction. Myoview (12/11) showed small fixed apical defect likely due to apical thinning. No ischemia. Echo (2/12): EF 35% with diffuse hypokinesis (worse anteroseptally), mild AS (mean gradient 16 mmHg), mild AI, moderate to severe MR, moderate biatrial enlargement, normal RV, PA systolic pressure 40 mmHg.  Echo (5/13): EF 45-50%, mild AS, mild to moderate MR, PA systolic pressure 52 mmHg.  7. Hyperlipidemia  8. Pulmonary Nodule 9. Aortic stenosis: mild.  10. MItral regurgitation: moderate to severe on 2/12 echo, mild to moderate on 5/13 echo.  11. Asthma 12. Carotid dopplers (9/12): 0-39% bilateral ICA stenosis.  13. Ascending aortic aneurysm: CTA chest 11/13 with 4.4 cm ascending aortic aneurysm (stable compared to prior).  CTA chest 12/14 with 4.4 cm ascending aortic aneurysm.    Current Outpatient Prescriptions  Medication Sig Dispense Refill  . ammonium lactate (AMLACTIN) 12 % cream       . calcium carbonate (OS-CAL)  600 MG TABS Take 600 mg by mouth 2 (two) times daily with a meal.        . citalopram (CELEXA) 20 MG tablet Take 20 mg by mouth daily.        . Cyanocobalamin (B-12) 2000 MCG TABS Take 1 tablet by mouth daily.      Marland Kitchen donepezil (ARICEPT) 10 MG tablet Take 10 mg by mouth at bedtime as needed.        . ferrous sulfate 325 (65 FE) MG tablet Take 325 mg by mouth daily.       Marland Kitchen letrozole (FEMARA) 2.5 MG tablet Take 1 tablet by mouth Daily.      Marland Kitchen levothyroxine (SYNTHROID, LEVOTHROID) 25 MCG tablet Take 25 mcg by mouth daily.        . Memantine HCl ER (NAMENDA XR) 14 MG CP24 Take 1 tablet (14 mg total) by mouth daily.  30 capsule  5  . metoprolol succinate (TOPROL-XL) 50 MG 24 hr tablet take 1 tablet by mouth once daily  90 tablet  3  . potassium chloride SA (KLOR-CON M20) 20 MEQ tablet take 1 tablet by mouth twice a day  180 tablet  3  . simvastatin (ZOCOR) 20 MG tablet Take 20 mg by mouth at bedtime.        Marland Kitchen tiotropium (SPIRIVA) 18 MCG inhalation capsule Place 18 mcg into inhaler and inhale daily.        . valsartan (DIOVAN) 80 MG tablet Take 0.5 tablets (40 mg total) by mouth daily.  90 tablet  3  . warfarin (COUMADIN) 2.5 MG tablet Take 1 tablet (2.5 mg total) by mouth as directed.  180 tablet  1  . torsemide (DEMADEX) 20 MG tablet 4 tablets (80mg ) AM and 2 tablets (40mg ) PM  180 tablet  3   No current facility-administered medications for this visit.    Allergies: Allergies  Allergen Reactions  . Lasix [Furosemide]     Discontinued by Dr Sandi Mariscal  . Penicillins   . Sulfonamide Derivatives     History  Substance Use Topics  . Smoking status: Never Smoker   . Smokeless tobacco: Never Used  . Alcohol Use: No     Comment: very infrequently    Family History:  Family hx of heart disease,cancer, and arthritis.  Brother- pancreatic cancer  ROS: All systems reviewed and negative except as per HPI.   PHYSICAL EXAM: VS:  BP 90/50  Pulse 70  Ht 5\' 7"  (1.702 m)  Wt 73.936 kg (163 lb)  BMI 25.52 kg/m2  SpO2 94%  Well nourished, well developed, in no acute distress HEENT: normal Neck: JVP 8 cm Cardiac:  S1, S2, regular; 3/6 systolic murmur RUSB/apex.  Lungs:  Clear bilaterally.  Abd: soft, nontender  Ext: 1+ edema 1/3 up lower legs bilaterally.  Skin: warm and dry Neuro: Alert, oriented, memory is deficient Psych: pleasant    Assessment/Plan:  ATRIAL FIBRILLATION  In NSR today. Continue coumadin. No recent falls. Continue Toprol XL. Chronic systolic heart failure   EF 45-50% on last echo. I think that she has had a tachycardia-mediated cardiomyopathy a tachycardia-mediated cardiomyopathy in the past.  Continue current doses of Toprol XL and valsartan. She continues to have NYHA class III symptoms, somewhat worse recently.  She appears mildly volume overloaded on exam and weight is up about 3 lbs.  I will increase torsemide to 80 qam, 40 qpm.  She will need BMET/BNP in 10 days and I will see her in followup  in 1 month. I am also going to get an echo to reassess LV systolic function and MR.  Mitral regurgitation Only mild-moderate on echo in 2013.  As above, I will be getting a repeat echo.   CKD Follow creatinine closely, BMET after increasing torsemide.    Ascending aortic aneurysm Stable at 4.4 cm in 12/14.  She would not be a good surgical candidate and aneurysm has been stable.  I do not think she needs further evaluation of this.   Loralie Champagne 07/26/2013

## 2013-07-26 NOTE — Patient Instructions (Signed)
Increase torsemide to 80mg  in the AM and 40mg  in the PM. This will be 4 of a 20mg  tablet (80mg ) in the AM and 2 of a 20mg  tablet (40mg ) in the PM.  Your physician has requested that you have an echocardiogram. Echocardiography is a painless test that uses sound waves to create images of your heart. It provides your doctor with information about the size and shape of your heart and how well your heart's chambers and valves are working. This procedure takes approximately one hour. There are no restrictions for this procedure.   Your physician recommends that you return for lab work in: 2-3 weeks--BMET/BNP.  Your physician recommends that you schedule a follow-up appointment in: 1 month with Dr Aundra Dubin.

## 2013-08-06 ENCOUNTER — Ambulatory Visit: Payer: Medicare Other | Admitting: Cardiology

## 2013-08-18 ENCOUNTER — Encounter: Payer: Self-pay | Admitting: Internal Medicine

## 2013-08-18 ENCOUNTER — Other Ambulatory Visit (INDEPENDENT_AMBULATORY_CARE_PROVIDER_SITE_OTHER): Payer: Medicare Other

## 2013-08-18 ENCOUNTER — Ambulatory Visit (HOSPITAL_COMMUNITY): Payer: Medicare Other | Attending: Cardiology | Admitting: Radiology

## 2013-08-18 ENCOUNTER — Ambulatory Visit (INDEPENDENT_AMBULATORY_CARE_PROVIDER_SITE_OTHER): Payer: Medicare Other | Admitting: *Deleted

## 2013-08-18 DIAGNOSIS — I4891 Unspecified atrial fibrillation: Secondary | ICD-10-CM

## 2013-08-18 DIAGNOSIS — I509 Heart failure, unspecified: Secondary | ICD-10-CM

## 2013-08-18 DIAGNOSIS — I5022 Chronic systolic (congestive) heart failure: Secondary | ICD-10-CM

## 2013-08-18 DIAGNOSIS — I5023 Acute on chronic systolic (congestive) heart failure: Secondary | ICD-10-CM

## 2013-08-18 DIAGNOSIS — Z7901 Long term (current) use of anticoagulants: Secondary | ICD-10-CM

## 2013-08-18 DIAGNOSIS — I359 Nonrheumatic aortic valve disorder, unspecified: Secondary | ICD-10-CM

## 2013-08-18 LAB — POCT INR: INR: 2.5

## 2013-08-18 LAB — BASIC METABOLIC PANEL
BUN: 23 mg/dL (ref 6–23)
CO2: 36 meq/L — AB (ref 19–32)
CREATININE: 1.6 mg/dL — AB (ref 0.4–1.2)
Calcium: 9.6 mg/dL (ref 8.4–10.5)
Chloride: 96 mEq/L (ref 96–112)
GFR: 31.79 mL/min — ABNORMAL LOW (ref >60.00)
Glucose, Bld: 90 mg/dL (ref 70–99)
Potassium: 4.2 mEq/L (ref 3.5–5.1)
Sodium: 141 mEq/L (ref 135–145)

## 2013-08-18 NOTE — Progress Notes (Signed)
Echocardiogram Performed. 

## 2013-08-26 ENCOUNTER — Telehealth: Payer: Self-pay | Admitting: Cardiology

## 2013-08-26 ENCOUNTER — Ambulatory Visit
Admission: RE | Admit: 2013-08-26 | Discharge: 2013-08-26 | Disposition: A | Discharge status: Home / Self Care | Payer: Medicare Other | Source: Ambulatory Visit | Attending: Family Medicine | Admitting: Family Medicine

## 2013-08-26 ENCOUNTER — Other Ambulatory Visit: Payer: Self-pay | Admitting: Family Medicine

## 2013-08-26 ENCOUNTER — Encounter: Payer: Self-pay | Admitting: *Deleted

## 2013-08-26 DIAGNOSIS — R0602 Shortness of breath: Secondary | ICD-10-CM

## 2013-08-26 NOTE — Telephone Encounter (Signed)
Spoke w/daughter, Lattie Haw.  Advised of Echo results and lab.  Will forward to Dr. Sandi Mariscal.  Reminded of app on 4/8 with Dr. Aundra Dubin.

## 2013-08-26 NOTE — Telephone Encounter (Signed)
New Message  Pt daughter called states that the pt it  experiencing SOB while walking outside. Requests a sooner appt than 09/01/2013.. No appts available.. Please assist

## 2013-08-26 NOTE — Telephone Encounter (Signed)
Daughter calling wanting to know if her Mom could be seen sooner than next Wed the 8th.  States she is having some SOB when she walks outside.  She is scheduled to see her PCP Dr.  Sandi Mariscal this afternoon.  Advised we had no appointments at this time w/Dr. Aundra Dubin, or any NP or PA Fri, Mon or Tues.  Advised for her to see Dr. Sandi Mariscal and he could address the SOB but if he felt like she needed to be seen by our office then to call back tomorrow (Fri) and would have to be evaluated by DOD.  She understands and will keep appointment with Dr. Sandi Mariscal.

## 2013-08-26 NOTE — Telephone Encounter (Signed)
Daughter calling requesting results of Echo and lab from 3/25.  Unable to find that these have been reviewed.  Will forward message to Dr. Aundra Dubin to see if he can review and send the results. Advised daughter that she will be called when results have been reviewed.

## 2013-08-26 NOTE — Telephone Encounter (Signed)
New problem   Pt want to know results of echocardiogram and labs. Please call to.

## 2013-08-26 NOTE — Telephone Encounter (Signed)
EF normal, there is significant diastolic dysfunction.  At least moderate and possibly severe MR, looks more like 2012 echo (?accuracy of 2013 echo with mild to moderate MR).  Moderate aortic stenosis.  Moderate pulmonary hypertension.  There are a number of issues here to explain increased fluid retention.  I don't think the echo was ever forwarded to my inbox.

## 2013-09-01 ENCOUNTER — Encounter: Payer: Self-pay | Admitting: Cardiology

## 2013-09-01 ENCOUNTER — Ambulatory Visit (INDEPENDENT_AMBULATORY_CARE_PROVIDER_SITE_OTHER): Payer: Medicare Other | Admitting: Cardiology

## 2013-09-01 VITALS — BP 124/67 | HR 66 | Ht 67.0 in | Wt 167.0 lb

## 2013-09-01 DIAGNOSIS — R001 Bradycardia, unspecified: Secondary | ICD-10-CM

## 2013-09-01 DIAGNOSIS — I4891 Unspecified atrial fibrillation: Secondary | ICD-10-CM

## 2013-09-01 DIAGNOSIS — I6529 Occlusion and stenosis of unspecified carotid artery: Secondary | ICD-10-CM

## 2013-09-01 DIAGNOSIS — R0602 Shortness of breath: Secondary | ICD-10-CM

## 2013-09-01 DIAGNOSIS — I5023 Acute on chronic systolic (congestive) heart failure: Secondary | ICD-10-CM

## 2013-09-01 DIAGNOSIS — I340 Nonrheumatic mitral (valve) insufficiency: Secondary | ICD-10-CM

## 2013-09-01 DIAGNOSIS — I5022 Chronic systolic (congestive) heart failure: Secondary | ICD-10-CM

## 2013-09-01 DIAGNOSIS — I498 Other specified cardiac arrhythmias: Secondary | ICD-10-CM

## 2013-09-01 DIAGNOSIS — I059 Rheumatic mitral valve disease, unspecified: Secondary | ICD-10-CM

## 2013-09-01 LAB — CBC WITH DIFFERENTIAL/PLATELET
BASOS ABS: 0 10*3/uL (ref 0.0–0.1)
Basophils Relative: 0.6 % (ref 0.0–3.0)
EOS ABS: 0.1 10*3/uL (ref 0.0–0.7)
Eosinophils Relative: 2.8 % (ref 0.0–5.0)
HCT: 33.9 % — ABNORMAL LOW (ref 36.0–46.0)
Hemoglobin: 11.3 g/dL — ABNORMAL LOW (ref 12.0–15.0)
Lymphocytes Relative: 19.3 % (ref 12.0–46.0)
Lymphs Abs: 0.9 10*3/uL (ref 0.7–4.0)
MCHC: 33.3 g/dL (ref 30.0–36.0)
MCV: 103.3 fl — ABNORMAL HIGH (ref 78.0–100.0)
MONO ABS: 0.4 10*3/uL (ref 0.1–1.0)
Monocytes Relative: 9.5 % (ref 3.0–12.0)
NEUTROS ABS: 3.2 10*3/uL (ref 1.4–7.7)
Neutrophils Relative %: 67.8 % (ref 43.0–77.0)
Platelets: 184 10*3/uL (ref 150.0–400.0)
RBC: 3.28 Mil/uL — ABNORMAL LOW (ref 3.87–5.11)
RDW: 14.8 % — AB (ref 11.5–14.6)
WBC: 4.7 10*3/uL (ref 4.5–10.5)

## 2013-09-01 LAB — BASIC METABOLIC PANEL
BUN: 11 mg/dL (ref 6–23)
CO2: 32 mEq/L (ref 19–32)
Calcium: 9.6 mg/dL (ref 8.4–10.5)
Chloride: 101 mEq/L (ref 96–112)
Creatinine, Ser: 1.2 mg/dL (ref 0.4–1.2)
GFR: 45.71 mL/min — AB (ref >60.00)
GLUCOSE: 96 mg/dL (ref 70–99)
Potassium: 5.1 mEq/L (ref 3.5–5.1)
Sodium: 137 mEq/L (ref 135–145)

## 2013-09-01 LAB — BRAIN NATRIURETIC PEPTIDE: Pro B Natriuretic peptide (BNP): 707 pg/mL — ABNORMAL HIGH (ref 0.0–100.0)

## 2013-09-01 MED ORDER — METOLAZONE 2.5 MG PO TABS: ORAL_TABLET | ORAL | Status: DC

## 2013-09-01 MED ORDER — POTASSIUM CHLORIDE CRYS ER 20 MEQ PO TBCR: EXTENDED_RELEASE_TABLET | ORAL | Status: DC

## 2013-09-01 NOTE — Patient Instructions (Addendum)
Take metolazone 2.5mg  two times a week on Mondays and Thursdays 30 minutes before you morning torsemide.   Increase KCL(potassium) to 2 tablets (40 mEq) in the AM and 1 tablet (20 mEq) in the PM on Mondays and Thursdays when you take metolazone.   Your physician recommends that you return for lab work today--BMET/BNP/CBCd.  Your physician recommends that you schedule a follow-up appointment in: 2 weeks with Dr Aundra Dubin in the [facility].  Your physician recommends that you return for lab work in: 2 weeks when you see Dr McLean--BMET/BNP.

## 2013-09-02 NOTE — Progress Notes (Signed)
Patient ID: Brianna Robertson, female   DOB: 01/16/1926, 80 y.o.   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  She has a history of dementia, atrial fibrillation, and possible tachycardia-mediated cardiomyopathy.  She went back into atrial fibrillation with rapid ventricular response in 12/11 and was hospitalized in [place]. Myoview showed no ischemia. Echo showed EF 20% with global hypokinesis. She was diuresed and rate was controlled. She was started on coumadin. No bleeding complications since starting coumadin. Repeat echo in 2/12 with rate control showed some improvement in EF, 35-40%. There was moderate to severe mitral regurgitation and mild aortic stenosis.  Most recent echo in 3/15 showed EF 55%, restrictive diastolic function, mild AI, moderate AS with AVA 1.1 cm^2, moderate to severe MR, normal RV, PA systolic pressure 61 mmHg.  She has been in NSR.   [name] has had slowly progressive dyspnea.  At last appointment, I increased her torsemide but weight is up another 4 lbs.  She has been short of breath with minimal exertion for a week.  She is very weak and tired.  She is sleeping a lot and is more confused.  She wheezes some.  No fever/chills.  No orthopnea or PND.  She had a CXR done last week with no acute abnormalities.  No chest pain.   ECG: NSR, septal Qs  Labs (2/13): K 4.6, creatinine 1.3, BNP 501 => 325 Labs (3/13): K 4, creatinine 1.3 Labs (8/13): K 4.1, creatinine 1.4, BNP 217 Labs (9/13): KL 4.1, creatinine 1.4, BNP 217, LDL 56, HDl 64 Labs (11/13): K 4.1, creatinine 1.5, BNP 118 Labs (12/14): creatinine 1.4 Labs (2/15): K 3.8, creatinine 1.6 Labs (3/15): K 4.2, creatinine 1.6, UA negative.   Past Medical History:  1. Breast cancer, hx of 1995. S/P lumpectomy and radiation (node, ER, PR negative).  2. Hypertension  3. Hypothyroidism  4. Atrial fibrillation: Initially found in 4/07 with suspected  tachycardia-mediated cardiomyopathy. Patient was cardioverted and maintained on amiodarone, and EF improved to 40%. Patient stopped amiodarone because of anorexia and stopped coumadin due to rectus sheath hematoma. Atrial fib with RVR recurred in 12/11. EF down to 20% on echo. Patient was rate-controlled and coumadin was restarted.  Recently, she has been back in NSR.  5. Moderate dementia  6. CKD: mild  7. Cardiomyopathy: Possibly tachycardia-mediated. In 4/07 in setting of atrial fibrillation with RVR, EF was 30%. After conversion to NSR, EF 40% in 8/09. In 12/11, noted to be back in atrial fibrillation with RVR. Echo (12/11) with EF 20%, severe global hypokinesis, mild to moderate MR, PA systolic pressure 39 mmHg, mild to moderate AS with mean gradient of 13 (may be low gradient AS in the setting of decreased cardiac output). Myoview in 4/07 was negative for ischemia or infarction. Myoview (12/11) showed small fixed apical defect likely due to apical thinning. No ischemia. Echo (2/12): EF 35% with diffuse hypokinesis (worse anteroseptally), mild AS (mean gradient 16 mmHg), mild AI, moderate to severe MR, moderate biatrial enlargement, normal RV, PA systolic pressure 40 mmHg.  Echo (5/13): EF 45-50%, mild AS, mild to moderate MR, PA systolic pressure 52 mmHg.  Echo (3/15) with EF 55%, restrictive diastolic function, mild AI, moderate AS (AVA 1.1 cm^2), moderate to severe MR, normal RV size and systolic function, PA systolic pressure 61 mmHg.  7. Hyperlipidemia  8. Pulmonary Nodule 9. Aortic stenosis: moderate.  10. MItral regurgitation: moderate to severe  on 2/12 echo, mild to moderate on 5/13 echo.  Moderate to severe on 3/15 echo.  11. Asthma 12. Carotid dopplers (9/12): 0-39% bilateral ICA stenosis.  13. Ascending aortic aneurysm: CTA chest 11/13 with 4.4 cm ascending aortic aneurysm (stable compared to prior).  CTA chest 12/14 with 4.4 cm ascending aortic aneurysm.    Current Outpatient  Prescriptions  Medication Sig Dispense Refill  . ammonium lactate (AMLACTIN) 12 % cream       . calcium carbonate (OS-CAL) 600 MG TABS Take 600 mg by mouth 2 (two) times daily with a meal.        . citalopram (CELEXA) 20 MG tablet Take 20 mg by mouth daily.        . Cyanocobalamin (B-12) 2000 MCG TABS Take 1 tablet by mouth daily.      Marland Kitchen donepezil (ARICEPT) 10 MG tablet Take 10 mg by mouth at bedtime as needed.        . ferrous sulfate 325 (65 FE) MG tablet Take 325 mg by mouth daily.      Marland Kitchen letrozole (FEMARA) 2.5 MG tablet Take 1 tablet by mouth Daily.      Marland Kitchen levothyroxine (SYNTHROID, LEVOTHROID) 25 MCG tablet Take 25 mcg by mouth daily.        . Memantine HCl ER (NAMENDA XR) 14 MG CP24 Take 1 tablet (14 mg total) by mouth daily.  30 capsule  5  . metoprolol succinate (TOPROL-XL) 50 MG 24 hr tablet take 1 tablet by mouth once daily  90 tablet  3  . simvastatin (ZOCOR) 20 MG tablet Take 20 mg by mouth at bedtime.        Marland Kitchen tiotropium (SPIRIVA) 18 MCG inhalation capsule Place 18 mcg into inhaler and inhale daily.        Marland Kitchen torsemide (DEMADEX) 20 MG tablet 4 tablets (80mg ) AM and 2 tablets (40mg ) PM  180 tablet  3  . valsartan (DIOVAN) 80 MG tablet Take 0.5 tablets (40 mg total) by mouth daily.  90 tablet  3  . warfarin (COUMADIN) 2.5 MG tablet Take 1 tablet (2.5 mg total) by mouth as directed.  180 tablet  1  . metolazone (ZAROXOLYN) 2.5 MG tablet 1 tablet two times a week on Mondays and Thursdays 30 minutes before your morning torsemide  30 tablet  3  . potassium chloride SA (K-DUR,KLOR-CON) 20 MEQ tablet 1 tablet two times a day except on Mondays and Thursdays take  2 tablets in the AM and 1 tablet in the PM.  90 tablet  3   No current facility-administered medications for this visit.    Allergies: Allergies  Allergen Reactions  . Lasix [Furosemide]     Discontinued by Dr Sandi Mariscal  . Penicillins   . Sulfonamide Derivatives     History  Substance Use Topics  . Smoking status: Never  Smoker   . Smokeless tobacco: Never Used  . Alcohol Use: No     Comment: very infrequently    Family History:  Family hx of heart disease,cancer, and arthritis.  Brother- pancreatic cancer  ROS: All systems reviewed and negative except as per HPI.   PHYSICAL EXAM: VS:  BP 124/67  Pulse 66  Ht 5\' 7"  (1.702 m)  Wt 75.751 kg (167 lb)  BMI 26.15 kg/m2  Well nourished, well developed, in no acute distress HEENT: normal Neck: JVP 10 cm Cardiac:  S1, S2, regular; 3/6 systolic murmur RUSB/apex.  Lungs:  Crackles at bases bilaterally.   Abd: soft,  nontender  Ext: 1+ edema to knees bilaterally Skin: warm and dry Neuro: Alert, oriented, memory is deficient Psych: pleasant   Assessment/Plan:  ATRIAL FIBRILLATION  In NSR today. Continue coumadin. No recent falls. Continue Toprol XL. Chronic diastolic heart failure   EF 55% on most recent echo with moderate AS, moderate to severe MR, and moderate pulmonary hypertension. She has NYHA class IIIb symptoms and is volume overloaded despite escalation of torsemide dosing recently.  - BMET/BNP today and again 2 wks.  - Add metolazone 2.5 mg 30 minutes prior to am torsemide twice a week on Mondays and Thursdays.   Valvular Heart Disease Moderate to severe MR and moderate AS on 3/15 echo.  CKD Follow creatinine closely, BMET after adding metolazone.     Ascending aortic aneurysm Stable at 4.4 cm in 12/14.  She would not be a good surgical candidate and aneurysm has been stable.  I do not think she needs further evaluation of this.   Larey Dresser 09/02/2013

## 2013-09-03 ENCOUNTER — Telehealth: Payer: Self-pay | Admitting: *Deleted

## 2013-09-03 DIAGNOSIS — R0602 Shortness of breath: Secondary | ICD-10-CM

## 2013-09-03 DIAGNOSIS — R001 Bradycardia, unspecified: Secondary | ICD-10-CM

## 2013-09-03 DIAGNOSIS — I5023 Acute on chronic systolic (congestive) heart failure: Secondary | ICD-10-CM

## 2013-09-03 DIAGNOSIS — I4891 Unspecified atrial fibrillation: Secondary | ICD-10-CM

## 2013-09-03 NOTE — Telephone Encounter (Signed)
Pt's daughter states by both monitor and feeling pulse she thinks pt is in at Fib--HR in the 47s. Pt was walking without oxygen and was SOB. Pt is now sitting down with oxygen and is breathing easier. Otherwise pt is unchanged symptomatically.  Pt weight 164lbs yesterday, 157 lbs today(she took metolazone yesterday).   Will forward to Dr Aundra Dubin.

## 2013-09-03 NOTE — Telephone Encounter (Signed)
She is probably going to be in atrial fibrillation periodically so as long as heart rate is controlled would not change anything.  Sounds like she had a pretty vigorous response to metolazone.  Would have her get BMET on Monday to make sure K and creatinine not severely worse.  She can hold valsartan/metoprolol if SBP < 100.

## 2013-09-03 NOTE — Telephone Encounter (Signed)
Pt's daughter advised, verbalized understanding.

## 2013-09-03 NOTE — Telephone Encounter (Signed)
I had called pt's daughter Lattie Haw about pt's recent lab results.   Pt's daughter mentioned today pt's BP had been 67/46 (attempted to recheck but monitor would not measure) HR 75, yesterday pt's BP 99/58 HR 68. Daughter states SBP usually in the 120 range.  Pt has been eating and drinking regularly and denies any illness. Pt denies lightheadedness dizziness and seems her usual self.  Only medication change is pt did take metolazone yesterday.  Pt has not taken metoprolol or valsartan today yet, she did take torsemide this AM.   I will forward to Dr Aundra Dubin.

## 2013-09-03 NOTE — Telephone Encounter (Signed)
Patients daughter is calling back. She now feels that her mother is in AFIB. She would like to know the next step. Please call and advise.

## 2013-09-06 ENCOUNTER — Other Ambulatory Visit (INDEPENDENT_AMBULATORY_CARE_PROVIDER_SITE_OTHER): Payer: Medicare Other

## 2013-09-06 DIAGNOSIS — R0602 Shortness of breath: Secondary | ICD-10-CM

## 2013-09-06 DIAGNOSIS — I498 Other specified cardiac arrhythmias: Secondary | ICD-10-CM

## 2013-09-06 DIAGNOSIS — I4891 Unspecified atrial fibrillation: Secondary | ICD-10-CM

## 2013-09-06 DIAGNOSIS — R001 Bradycardia, unspecified: Secondary | ICD-10-CM

## 2013-09-06 DIAGNOSIS — I5023 Acute on chronic systolic (congestive) heart failure: Secondary | ICD-10-CM

## 2013-09-06 LAB — BASIC METABOLIC PANEL
BUN: 33 mg/dL — ABNORMAL HIGH (ref 6–23)
CO2: 39 mEq/L — ABNORMAL HIGH (ref 19–32)
Calcium: 9.9 mg/dL (ref 8.4–10.5)
Chloride: 87 mEq/L — ABNORMAL LOW (ref 96–112)
Creatinine, Ser: 2.1 mg/dL — ABNORMAL HIGH (ref 0.4–1.2)
GFR: 23.47 mL/min — ABNORMAL LOW (ref >60.00)
Glucose, Bld: 115 mg/dL — ABNORMAL HIGH (ref 70–99)
POTASSIUM: 3.5 meq/L (ref 3.5–5.1)
SODIUM: 138 meq/L (ref 135–145)

## 2013-09-08 ENCOUNTER — Encounter: Payer: Self-pay | Admitting: Internal Medicine

## 2013-09-08 ENCOUNTER — Telehealth: Payer: Self-pay | Admitting: Cardiology

## 2013-09-08 NOTE — Telephone Encounter (Signed)
Spoke with patient's daughter about recent lab results

## 2013-09-08 NOTE — Telephone Encounter (Signed)
New Message  Pt daughter called requests a call back to discuss Lab results. She also states that the pt has lost a lot of weight after the medication was changed.. Requests a call back to discuss that as well. Please call

## 2013-09-10 ENCOUNTER — Ambulatory Visit (HOSPITAL_COMMUNITY)
Admission: RE | Admit: 2013-09-10 | Discharge: 2013-09-10 | Disposition: A | Discharge status: Home / Self Care | Payer: Medicare Other | Source: Ambulatory Visit | Attending: Cardiology | Admitting: Cardiology

## 2013-09-10 ENCOUNTER — Encounter (HOSPITAL_COMMUNITY): Payer: Self-pay

## 2013-09-10 VITALS — BP 90/56 | HR 110 | Resp 19 | Ht 63.0 in | Wt 155.4 lb

## 2013-09-10 DIAGNOSIS — I712 Thoracic aortic aneurysm, without rupture, unspecified: Secondary | ICD-10-CM

## 2013-09-10 DIAGNOSIS — I7121 Aneurysm of the ascending aorta, without rupture: Secondary | ICD-10-CM

## 2013-09-10 DIAGNOSIS — I5023 Acute on chronic systolic (congestive) heart failure: Secondary | ICD-10-CM | POA: Insufficient documentation

## 2013-09-10 DIAGNOSIS — N289 Disorder of kidney and ureter, unspecified: Secondary | ICD-10-CM | POA: Insufficient documentation

## 2013-09-10 DIAGNOSIS — N259 Disorder resulting from impaired renal tubular function, unspecified: Secondary | ICD-10-CM

## 2013-09-10 DIAGNOSIS — I4891 Unspecified atrial fibrillation: Secondary | ICD-10-CM | POA: Insufficient documentation

## 2013-09-10 DIAGNOSIS — I5022 Chronic systolic (congestive) heart failure: Secondary | ICD-10-CM

## 2013-09-10 MED ORDER — METOPROLOL SUCCINATE ER 50 MG PO TB24: ORAL_TABLET | ORAL | Status: DC

## 2013-09-10 NOTE — Progress Notes (Signed)
Patient ID: Brianna Robertson, female   DOB: 09-May-1926, 80 y.o.   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  She has a history of dementia, atrial fibrillation, and possible tachycardia-mediated cardiomyopathy.  She went back into atrial fibrillation with rapid ventricular response in 12/11 and was hospitalized in [place]. Myoview showed no ischemia. Echo showed EF 20% with global hypokinesis. She was diuresed and rate was controlled. She was started on coumadin. No bleeding complications since starting coumadin. Repeat echo in 2/12 with rate control showed some improvement in EF, 35-40%. There was moderate to severe mitral regurgitation and mild aortic stenosis.  Most recent echo in 3/15 showed EF 55%, restrictive diastolic function, mild AI, moderate AS with AVA 1.1 cm^2, moderate to severe MR, normal RV, PA systolic pressure 61 mmHg.  She has been in NSR.   [name] returns for follow up. Metolazone has been held for 1 week due to elevated creatinine. Still SOB with exertion. Able to walk 10 steps very slowly. Complains of fatigue. Weight at home trending down from 167 to 154 pounds with metolazone use.  Using 2 liters [place] at night and as needed. She is in atrial fibrillation today. Family has noted lower blood pressures at home.  She has had worse balance x 1 month with some falls.   ECG: atrial fibrillation with LBBB  Labs (2/13): K 4.6, creatinine 1.3, BNP 501 => 325 Labs (3/13): K 4, creatinine 1.3 Labs (8/13): K 4.1, creatinine 1.4, BNP 217 Labs (9/13): KL 4.1, creatinine 1.4, BNP 217, LDL 56, HDl 64 Labs (11/13): K 4.1, creatinine 1.5, BNP 118 Labs (12/14): creatinine 1.4 Labs (2/15): K 3.8, creatinine 1.6 Labs (3/15): K 4.2, creatinine 1.6, UA negative.  Labs (09/01/13) K 5.1, creatinine 1.2  Labs (09/06/13) K 3.5, creatinine 2.1  Past Medical History:  1. Breast cancer, hx of 1995. S/P lumpectomy and radiation (node,  ER, PR negative).  2. Hypertension  3. Hypothyroidism  4. 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 Patient was cardioverted and maintained on amiodarone, and EF improved to 40%. Patient stopped amiodarone because of anorexia and stopped coumadin due to rectus sheath hematoma. Atrial fib with RVR recurred in 12/11. EF down to 20% on echo. Patient was rate-controlled and coumadin was restarted.  Recently, she has been back in NSR.  5. Moderate dementia  6. CKD: mild  7. Cardiomyopathy: Possibly tachycardia-mediated. In 4/07 in setting of atrial fibrillation with RVR, EF was 30%. After conversion to NSR, EF 40% in 8/09. In 12/11, noted to be back in atrial fibrillation with RVR. Echo (12/11) with EF 20%, severe global hypokinesis, mild to moderate MR, PA systolic pressure 39 mmHg, mild to moderate AS with mean gradient of 13 (may be low gradient AS in the setting of decreased cardiac output). Myoview in 4/07 was negative for ischemia or infarction. Myoview (12/11) showed small fixed apical defect likely due to apical thinning. No ischemia. Echo (2/12): EF 35% with diffuse hypokinesis (worse anteroseptally), mild AS (mean gradient 16 mmHg), mild AI, moderate to severe MR, moderate biatrial enlargement, normal RV, PA systolic pressure 40 mmHg.  Echo (5/13): EF 45-50%, mild AS, mild to moderate MR, PA systolic pressure 52 mmHg.  Echo (3/15) with EF 55%, restrictive diastolic function, mild AI, moderate AS (AVA 1.1 cm^2), moderate to severe MR, normal RV size and systolic function, PA systolic pressure 61 mmHg.  7. Hyperlipidemia  8. Pulmonary Nodule 9. Aortic stenosis: moderate.  10. MItral regurgitation: moderate to severe on 2/12 echo, mild to moderate on 5/13 echo.  Moderate to severe on 3/15 echo.  11. Asthma 12. Carotid dopplers (9/12): 0-39% bilateral ICA stenosis.  13. Ascending aortic aneurysm: CTA chest 11/13 with 4.4 cm ascending aortic  aneurysm (stable compared to prior).  CTA chest 12/14 with 4.4 cm ascending aortic aneurysm.    Current Outpatient Prescriptions  Medication Sig Dispense Refill  . calcium carbonate (OS-CAL) 600 MG TABS Take 600 mg by mouth 2 (two) times daily with a meal.        . citalopram (CELEXA) 20 MG tablet Take 20 mg by mouth daily.        . Cyanocobalamin (B-12) 2000 MCG TABS Take 1 tablet by mouth daily.      Marland Kitchen donepezil (ARICEPT) 10 MG tablet Take 10 mg by mouth at bedtime as needed.        . ferrous sulfate 325 (65 FE) MG tablet Take 325 mg by mouth daily.      Marland Kitchen letrozole (FEMARA) 2.5 MG tablet Take 1 tablet by mouth Daily.      Marland Kitchen levothyroxine (SYNTHROID, LEVOTHROID) 25 MCG tablet Take 25 mcg by mouth daily.        . Memantine HCl ER (NAMENDA XR) 14 MG CP24 Take 1 tablet (14 mg total) by mouth daily.  30 capsule  5  . metolazone (ZAROXOLYN) 2.5 MG tablet 1 tablet one time a week on Monday 30 minutes before your morning torsemide      . metoprolol succinate (TOPROL-XL) 50 MG 24 hr tablet Take 50 mg by mouth daily. Take 1 in the morning and 1 in the evening.      . potassium chloride SA (K-DUR,KLOR-CON) 20 MEQ tablet 1 tablet (20 mEq total) 2 (two) times daily.      . simvastatin (ZOCOR) 20 MG tablet Take 20 mg by mouth at bedtime.        Marland Kitchen tiotropium (SPIRIVA) 18 MCG inhalation capsule Place 18 mcg into inhaler and inhale daily.        Marland Kitchen torsemide (DEMADEX) 20 MG tablet 4 tablets (80mg ) AM and 2 tablets (40mg ) PM  180 tablet  3  . warfarin (COUMADIN) 2.5 MG tablet Take 1 tablet (2.5 mg total) by mouth as directed.  180 tablet  1   No current facility-administered medications for this encounter.    Allergies: Allergies  Allergen Reactions  . Lasix [Furosemide]     Discontinued by Dr Sandi Mariscal  . Penicillins   . Sulfonamide Derivatives     History  Substance Use Topics  . Smoking status: Never Smoker   . Smokeless tobacco: Never Used  . Alcohol Use: No     Comment: very infrequently     Family History:  Family hx of heart disease,cancer, and arthritis.  Brother- pancreatic cancer  ROS: All systems reviewed and negative except as per HPI.   PHYSICAL EXAM: VS:  BP 90/56  Pulse 110  Resp 19  Ht 5\' 3"  (1.6 m)  Wt 155 lb 6.4 oz (70.489 kg)  BMI 27.53 kg/m2  SpO2 94%  Well nourished, well developed, in no acute distress HEENT: normal Neck: JVP 8 cm Cardiac:  S1, S2, regular; 3/6 systolic murmur RUSB/apex.  Lungs:  Crackles at bases bilaterally.   Abd: soft, nontender  Ext: 1+ edema to knees bilaterally, improved.  Skin: warm and dry Neuro: Alert, oriented, memory is deficient Psych: pleasant  Assessment/Plan:  ATRIAL FIBRILLATION  Back in atrial fibrillation, mild RVR.  I will have her take Toprol XL 50 qam, 25 qpm. She will continue coumadin for now but if she has more falls, may have to stop it.  Chronic diastolic heart failure   EF 55% on most recent echo with moderate AS, moderate to severe MR, and moderate pulmonary hypertension. She has NYHA class IIIb symptoms.  Volume status is better since metolazone was added though she is still symptomatically short of breath.  Weight is down 12 lbs but BP is down and creatinine is up.  - BMET to be repeated on Tuesday.   - Suspect that we took fluid off too fast with metolazone.  Stop metolazone and will hold torsemide x 2 days, then resume on Sunday on her current dose 80 qam, 40 qpm.    Valvular Heart Disease Moderate to severe MR and moderate AS on 3/15 echo.  CKD Creatinine up with metolazone use, as above think we may have taken fluid off too fast.  I will have her stop metolazone as above and hold torsemide for a couple of days.   Repeat BMET on Tuesday.  Ascending aortic aneurysm Stable at 4.4 cm in 12/14.  She would not be a good surgical candidate and aneurysm has been stable.  I do not think she needs further evaluation of this.   Amy D Clegg NP-C 09/10/2013  Patient seen with NP, agree with the above  note.  Weight down but creatinine up and BP lower.  May have taken fluid off too fast.  Also back in atrial fibrillation.   - She will take Toprol XL 50 qam, 25 qpm.   - Monitor falls/balance, may have to stop coumadin.  - Hold metolazone for now.  Hold torsemide x 2 days then resume 80 mg qam, 40 mg qpm on Sunday. - BMET Tuesday, followup in 1 week.   Larey Dresser 09/12/2013

## 2013-09-10 NOTE — Patient Instructions (Signed)
Stop Metolazone  Hold Torsemide today and tomorrow, resume on Sunday  Decrease Toprol (metorpolol) to 1 tab in AM and 1/2 tab in PM  Your physician recommends that you schedule a follow-up appointment in: 1 week

## 2013-09-13 ENCOUNTER — Other Ambulatory Visit (HOSPITAL_COMMUNITY): Payer: Self-pay | Admitting: Surgery

## 2013-09-15 DIAGNOSIS — I5023 Acute on chronic systolic (congestive) heart failure: Secondary | ICD-10-CM

## 2013-09-15 DIAGNOSIS — I4891 Unspecified atrial fibrillation: Secondary | ICD-10-CM

## 2013-09-15 DIAGNOSIS — I509 Heart failure, unspecified: Secondary | ICD-10-CM

## 2013-09-15 DIAGNOSIS — I059 Rheumatic mitral valve disease, unspecified: Secondary | ICD-10-CM

## 2013-09-16 ENCOUNTER — Encounter (HOSPITAL_COMMUNITY): Payer: Medicare Other

## 2013-09-17 ENCOUNTER — Ambulatory Visit (HOSPITAL_COMMUNITY)
Admission: RE | Admit: 2013-09-17 | Discharge: 2013-09-17 | Disposition: A | Discharge status: Home / Self Care | Payer: Medicare Other | Source: Ambulatory Visit | Attending: Cardiology | Admitting: Cardiology

## 2013-09-17 ENCOUNTER — Encounter (HOSPITAL_COMMUNITY): Payer: Self-pay

## 2013-09-17 ENCOUNTER — Ambulatory Visit (INDEPENDENT_AMBULATORY_CARE_PROVIDER_SITE_OTHER): Payer: Medicare Other | Admitting: Internal Medicine

## 2013-09-17 ENCOUNTER — Ambulatory Visit (HOSPITAL_COMMUNITY)
Admission: RE | Admit: 2013-09-17 | Discharge: 2013-09-17 | Disposition: A | Discharge status: Home / Self Care | Payer: Medicare Other | Source: Ambulatory Visit | Attending: Internal Medicine | Admitting: Internal Medicine

## 2013-09-17 ENCOUNTER — Telehealth (HOSPITAL_COMMUNITY): Payer: Self-pay | Admitting: *Deleted

## 2013-09-17 VITALS — BP 96/54 | HR 58 | Wt 154.1 lb

## 2013-09-17 DIAGNOSIS — I4891 Unspecified atrial fibrillation: Secondary | ICD-10-CM

## 2013-09-17 DIAGNOSIS — J988 Other specified respiratory disorders: Secondary | ICD-10-CM | POA: Insufficient documentation

## 2013-09-17 DIAGNOSIS — I340 Nonrheumatic mitral (valve) insufficiency: Secondary | ICD-10-CM

## 2013-09-17 DIAGNOSIS — N259 Disorder resulting from impaired renal tubular function, unspecified: Secondary | ICD-10-CM

## 2013-09-17 DIAGNOSIS — J449 Chronic obstructive pulmonary disease, unspecified: Secondary | ICD-10-CM

## 2013-09-17 DIAGNOSIS — N289 Disorder of kidney and ureter, unspecified: Secondary | ICD-10-CM | POA: Insufficient documentation

## 2013-09-17 DIAGNOSIS — I5023 Acute on chronic systolic (congestive) heart failure: Secondary | ICD-10-CM | POA: Insufficient documentation

## 2013-09-17 DIAGNOSIS — J4489 Other specified chronic obstructive pulmonary disease: Secondary | ICD-10-CM

## 2013-09-17 DIAGNOSIS — I059 Rheumatic mitral valve disease, unspecified: Secondary | ICD-10-CM | POA: Insufficient documentation

## 2013-09-17 DIAGNOSIS — I5022 Chronic systolic (congestive) heart failure: Secondary | ICD-10-CM

## 2013-09-17 DIAGNOSIS — Z7901 Long term (current) use of anticoagulants: Secondary | ICD-10-CM

## 2013-09-17 LAB — BASIC METABOLIC PANEL
BUN: 27 mg/dL — AB (ref 6–23)
CO2: 35 meq/L — AB (ref 19–32)
Calcium: 9.8 mg/dL (ref 8.4–10.5)
Chloride: 95 mEq/L — ABNORMAL LOW (ref 96–112)
Creatinine, Ser: 1.59 mg/dL — ABNORMAL HIGH (ref 0.50–1.10)
GFR calc Af Amer: 33 mL/min — ABNORMAL LOW (ref >90)
GFR calc non Af Amer: 28 mL/min — ABNORMAL LOW (ref >90)
GLUCOSE: 99 mg/dL (ref 70–99)
POTASSIUM: 4 meq/L (ref 3.7–5.3)
Sodium: 141 mEq/L (ref 137–147)

## 2013-09-17 LAB — PROTIME-INR
INR: 5.6 — AB (ref 0.00–1.49)
Prothrombin Time: 48.5 seconds — ABNORMAL HIGH (ref 11.6–15.2)

## 2013-09-17 LAB — CBC
HCT: 39.2 % (ref 36.0–46.0)
HEMOGLOBIN: 12.6 g/dL (ref 12.0–15.0)
MCH: 34.3 pg — AB (ref 26.0–34.0)
MCHC: 32.1 g/dL (ref 30.0–36.0)
MCV: 106.8 fL — AB (ref 78.0–100.0)
Platelets: 187 10*3/uL (ref 150–400)
RBC: 3.67 MIL/uL — ABNORMAL LOW (ref 3.87–5.11)
RDW: 14.9 % (ref 11.5–15.5)
WBC: 4.6 10*3/uL (ref 4.0–10.5)

## 2013-09-17 LAB — PRO B NATRIURETIC PEPTIDE: Pro B Natriuretic peptide (BNP): 3700 pg/mL — ABNORMAL HIGH (ref 0–450)

## 2013-09-17 LAB — TSH: TSH: 1.09 u[IU]/mL (ref 0.350–4.500)

## 2013-09-17 MED ORDER — METOPROLOL SUCCINATE ER 25 MG PO TB24: 25.0000 mg | ORAL_TABLET | Freq: Every day | ORAL | Status: AC

## 2013-09-17 MED ORDER — TORSEMIDE 20 MG PO TABS: ORAL_TABLET | ORAL | Status: AC

## 2013-09-17 MED ORDER — TORSEMIDE 20 MG PO TABS: 80.0000 mg | ORAL_TABLET | Freq: Every day | ORAL | Status: DC

## 2013-09-17 NOTE — Telephone Encounter (Signed)
Message copied by Scarlette Calico on Fri Sep 17, 2013  4:44 PM ------      Message from: Larey Dresser      Created: Fri Sep 17, 2013  3:53 PM       Creatinine ok.  Do not decrease torsemide.  Keep torsemide 80 qam, 40 qpm. ------

## 2013-09-17 NOTE — Patient Instructions (Signed)
Decrease Torsemide to 80 mg (4 tabs) in AM   Decrease Metoprolol to 25 mg daily  Please wear your oxygen all the time  Labs today  Chest x-ray today  Your physician recommends that you schedule a follow-up appointment in: 1 week

## 2013-09-19 NOTE — Progress Notes (Signed)
Patient ID: Brianna Robertson, female   DOB: [birth year], 80 y.o.   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  She has a history of dementia, atrial fibrillation, and possible tachycardia-mediated cardiomyopathy.  She went back into atrial fibrillation with rapid ventricular response in 12/11 and was hospitalized in [place]. Myoview showed no ischemia. Echo showed EF 20% with global hypokinesis. She was diuresed and rate was controlled. She was started on coumadin. No bleeding complications since starting coumadin. Repeat echo in 2/12 with rate control showed some improvement in EF, 35-40%. There was moderate to severe mitral regurgitation and mild aortic stenosis.  Most recent echo in 3/15 showed EF 55%, restrictive diastolic function, mild AI, moderate AS with AVA 1.1 cm^2, moderate to severe MR, normal RV, PA systolic pressure 61 mmHg.  She has been in NSR.   [name] continues to do poorly.  BP is still running low, SBP in the 80s-90s at home.  She is actually back in NSR today.  Her daughter thinks she has been back in rhythm since Wednesday.  She has generalized weakness.  No fever, cough, dysuria.  She is lightheaded with standing.  She does not report dyspnea walking around her house.  Weight is down 1 lb.  Creatinine better, today, down to 1.6.  Oxygen saturation 88% after walking in today.   ECG: NSR, borderline LBBB, septal Qs  Labs (2/13): K 4.6, creatinine 1.3, BNP 501 => 325 Labs (3/13): K 4, creatinine 1.3 Labs (8/13): K 4.1, creatinine 1.4, BNP 217 Labs (9/13): KL 4.1, creatinine 1.4, BNP 217, LDL 56, HDl 64 Labs (11/13): K 4.1, creatinine 1.5, BNP 118 Labs (12/14): creatinine 1.4 Labs (2/15): K 3.8, creatinine 1.6 Labs (3/15): K 4.2, creatinine 1.6, UA negative.  Labs (09/01/13) K 5.1, creatinine 1.2  Labs (09/06/13) K 3.5, creatinine 2.1 Labs (09/07/13): creatinine 1.6, HCT 39  Past Medical History:  1. Breast  cancer, hx of 1995. S/P lumpectomy and radiation (node, ER, PR negative).  2. Hypertension  3. Hypothyroidism  4. 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 Patient was cardioverted and maintained on amiodarone, and EF improved to 40%. Patient stopped amiodarone because of anorexia and stopped coumadin due to rectus sheath hematoma. Atrial fib with RVR recurred in 12/11. EF down to 20% on echo. Patient was rate-controlled and coumadin was restarted.  Recently, she has been back in NSR.  5. Moderate dementia  6. CKD: mild  7. Cardiomyopathy: Possibly tachycardia-mediated. In 4/07 in setting of atrial fibrillation with RVR, EF was 30%. After conversion to NSR, EF 40% in 8/09. In 12/11, noted to be back in atrial fibrillation with RVR. Echo (12/11) with EF 20%, severe global hypokinesis, mild to moderate MR, PA systolic pressure 39 mmHg, mild to moderate AS with mean gradient of 13 (may be low gradient AS in the setting of decreased cardiac output). Myoview in 4/07 was negative for ischemia or infarction. Myoview (12/11) showed small fixed apical defect likely due to apical thinning. No ischemia. Echo (2/12): EF 35% with diffuse hypokinesis (worse anteroseptally), mild AS (mean gradient 16 mmHg), mild AI, moderate to severe MR, moderate biatrial enlargement, normal RV, PA systolic pressure 40 mmHg.  Echo (5/13): EF 45-50%, mild AS, mild to moderate MR, PA systolic pressure 52 mmHg.  Echo (3/15) with EF 55%, restrictive diastolic function, mild AI, moderate AS (AVA 1.1 cm^2), moderate to severe MR, normal RV size  and systolic function, PA systolic pressure 61 mmHg.  7. Hyperlipidemia  8. Pulmonary Nodule 9. Aortic stenosis: moderate.  10. MItral regurgitation: moderate to severe on 2/12 echo, mild to moderate on 5/13 echo.  Moderate to severe on 3/15 echo.  11. Asthma 12. Carotid dopplers (9/12): 0-39% bilateral ICA stenosis.  13. Ascending aortic  aneurysm: CTA chest 11/13 with 4.4 cm ascending aortic aneurysm (stable compared to prior).  CTA chest 12/14 with 4.4 cm ascending aortic aneurysm.    Current Outpatient Prescriptions  Medication Sig Dispense Refill  . calcium carbonate (OS-CAL) 600 MG TABS Take 600 mg by mouth 2 (two) times daily with a meal.        . citalopram (CELEXA) 20 MG tablet Take 20 mg by mouth daily.        . Cyanocobalamin (B-12) 2000 MCG TABS Take 1 tablet by mouth daily.      Marland Kitchen donepezil (ARICEPT) 10 MG tablet Take 10 mg by mouth at bedtime as needed.        . ferrous sulfate 325 (65 FE) MG tablet Take 325 mg by mouth daily.      Marland Kitchen letrozole (FEMARA) 2.5 MG tablet Take 1 tablet by mouth Daily.      Marland Kitchen levothyroxine (SYNTHROID, LEVOTHROID) 25 MCG tablet Take 25 mcg by mouth daily.        . Memantine HCl ER (NAMENDA XR) 14 MG CP24 Take 1 tablet (14 mg total) by mouth daily.  30 capsule  5  . metoprolol succinate (TOPROL-XL) 25 MG 24 hr tablet Take 1 tablet (25 mg total) by mouth daily.      . potassium chloride SA (K-DUR,KLOR-CON) 20 MEQ tablet 1 tablet (20 mEq total) 2 (two) times daily.      . simvastatin (ZOCOR) 20 MG tablet Take 20 mg by mouth at bedtime.        Marland Kitchen tiotropium (SPIRIVA) 18 MCG inhalation capsule Place 18 mcg into inhaler and inhale daily.        Marland Kitchen warfarin (COUMADIN) 2.5 MG tablet Take 1 tablet (2.5 mg total) by mouth as directed.  180 tablet  1  . torsemide (DEMADEX) 20 MG tablet Take 80 mg (4 tabs) in AM and 40 mg (2 tabs) in PM  180 tablet  3   No current facility-administered medications for this encounter.    Allergies: Allergies  Allergen Reactions  . Lasix [Furosemide]     Discontinued by Dr Sandi Mariscal  . Penicillins   . Sulfonamide Derivatives     History  Substance Use Topics  . Smoking status: Never Smoker   . Smokeless tobacco: Never Used  . Alcohol Use: No     Comment: very infrequently    Family History:  Family hx of heart disease,cancer, and arthritis.  Brother-  pancreatic cancer  ROS: All systems reviewed and negative except as per HPI.   PHYSICAL EXAM: VS:  BP 96/54  Pulse 58  Wt 154 lb 1.9 oz (69.908 kg)  SpO2 88%  Well nourished, well developed, in no acute distress HEENT: normal Neck: JVP not elevated Cardiac:  S1, S2, regular; 3/6 systolic murmur RUSB/apex.  Lungs:  Crackles at bases bilaterally.   Abd: soft, nontender  Ext: Trace ankle edema.  Skin: warm and dry Neuro: Alert, oriented, memory is deficient Psych: pleasant   Assessment/Plan:  ATRIAL FIBRILLATION  Now back in NSR.  Given hypotension, I am going to have her decreased Toprol XL to 25 mg daily.  Chronic diastolic heart failure  EF 55% on most recent echo with moderate AS, moderate to severe MR, and moderate pulmonary hypertension. She has NYHA class IIIb symptoms.  She does not appear particularly volume overloaded.  Creatinine better on today's check. - Continue torsemide 80 qam, 40 qpm.  - BMET at followup.  - She is hypoxic at rest today.  I would like her to use oxygen at all times.   Valvular Heart Disease Moderate to severe MR and moderate AS on 3/15 echo.  CKD Creatinine improved.  Will continue current torsemide as her volume status looks good.  Ascending aortic aneurysm Stable at 4.4 cm in 12/14.  She would not be a good surgical candidate and aneurysm has been stable.  I do not think she needs further evaluation of this.  Generalized weakness Possibly due to low BP.  As above, cutting back on Toprol XL.  She does not seem to be infected.  I will get a CBC to assess for anemia and leukocytosis.  I will also check TSH. I will get a CXR.   Larey Dresser 09/19/2013

## 2013-09-23 ENCOUNTER — Ambulatory Visit (HOSPITAL_COMMUNITY)
Admission: RE | Admit: 2013-09-23 | Discharge: 2013-09-23 | Disposition: A | Discharge status: Home / Self Care | Payer: Medicare Other | Source: Ambulatory Visit | Attending: Cardiology | Admitting: Cardiology

## 2013-09-23 ENCOUNTER — Encounter (HOSPITAL_COMMUNITY): Payer: Self-pay

## 2013-09-23 VITALS — BP 96/48 | HR 54 | Wt 153.1 lb

## 2013-09-23 DIAGNOSIS — N289 Disorder of kidney and ureter, unspecified: Secondary | ICD-10-CM | POA: Insufficient documentation

## 2013-09-23 DIAGNOSIS — I4891 Unspecified atrial fibrillation: Secondary | ICD-10-CM | POA: Insufficient documentation

## 2013-09-23 DIAGNOSIS — N259 Disorder resulting from impaired renal tubular function, unspecified: Secondary | ICD-10-CM

## 2013-09-23 DIAGNOSIS — I340 Nonrheumatic mitral (valve) insufficiency: Secondary | ICD-10-CM

## 2013-09-23 DIAGNOSIS — I059 Rheumatic mitral valve disease, unspecified: Secondary | ICD-10-CM | POA: Insufficient documentation

## 2013-09-23 LAB — PROTIME-INR
INR: 1.75 — AB (ref 0.00–1.49)
PROTHROMBIN TIME: 19.9 s — AB (ref 11.6–15.2)

## 2013-09-23 NOTE — Progress Notes (Signed)
Patient ID: Brianna Robertson, female   DOB: [birth year], 80 y.o.   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  She has a history of dementia, atrial fibrillation, and possible tachycardia-mediated cardiomyopathy.  She went back into atrial fibrillation with rapid ventricular response in 12/11 and was hospitalized in [place]. Myoview showed no ischemia. Echo showed EF 20% with global hypokinesis. She was diuresed and rate was controlled. She was started on coumadin. No bleeding complications since starting coumadin. Repeat echo in 2/12 with rate control showed some improvement in EF, 35-40%. There was moderate to severe mitral regurgitation and mild aortic stenosis.  Most recent echo in 3/15 showed EF 55%, restrictive diastolic function, mild AI, moderate AS with AVA 1.1 cm^2, moderate to severe MR, normal RV, PA systolic pressure 61 mmHg.    Today, she is doing better.  At a prior appointment, she was noted to be in atrial fibrillation again and to have built up considerable volume overload.  We diuresed her more aggressively, and while she lost weight, she developed worsening renal function and low BP with lightheadedness.  However, she is now back in NSR.  I have cut back on her Toprol XL to 25 mg daily and she is much less lightheaded.  BP has been higher, SBP 90s-110s at home (no more readings in the 80s).  She is using oxygen with exertion and at night. Weight is down 1 lb.  She is not short of breath walking around in her house.  No chest pain.  She has not fallen recently.   ECG: NSR, borderline LBBB, septal Qs  Labs (2/13): K 4.6, creatinine 1.3, BNP 501 => 325 Labs (3/13): K 4, creatinine 1.3 Labs (8/13): K 4.1, creatinine 1.4, BNP 217 Labs (9/13): KL 4.1, creatinine 1.4, BNP 217, LDL 56, HDl 64 Labs (11/13): K 4.1, creatinine 1.5, BNP 118 Labs (12/14): creatinine 1.4 Labs (2/15): K 3.8, creatinine 1.6 Labs (3/15): K 4.2,  creatinine 1.6, UA negative.  Labs (09/01/13) K 5.1, creatinine 1.2  Labs (09/06/13) K 3.5, creatinine 2.1 Labs (09/07/13): creatinine 1.6, HCT 39, TSH normal, pro-BNP 3700  Past Medical History:  1. Breast cancer, hx of 1995. S/P lumpectomy and radiation (node, ER, PR negative).  2. Hypertension  3. Hypothyroidism  4. 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 Patient was cardioverted and maintained on amiodarone, and EF improved to 40%. Patient stopped amiodarone because of anorexia and stopped coumadin due to rectus sheath hematoma. Atrial fib with RVR recurred in 12/11. EF down to 20% on echo. Patient was rate-controlled and coumadin was restarted.  Recently, she has been back in NSR.  5. Moderate dementia  6. CKD: mild  7. Cardiomyopathy: Possibly tachycardia-mediated. In 4/07 in setting of atrial fibrillation with RVR, EF was 30%. After conversion to NSR, EF 40% in 8/09. In 12/11, noted to be back in atrial fibrillation with RVR. Echo (12/11) with EF 20%, severe global hypokinesis, mild to moderate MR, PA systolic pressure 39 mmHg, mild to moderate AS with mean gradient of 13 (may be low gradient AS in the setting of decreased cardiac output). Myoview in 4/07 was negative for ischemia or infarction. Myoview (12/11) showed small fixed apical defect likely due to apical thinning. No ischemia. Echo (2/12): EF 35% with diffuse hypokinesis (worse anteroseptally), mild AS (mean gradient 16 mmHg), mild AI, moderate to severe MR, moderate biatrial enlargement, normal RV, PA systolic pressure  40 mmHg.  Echo (5/13): EF 45-50%, mild AS, mild to moderate MR, PA systolic pressure 52 mmHg.  Echo (3/15) with EF 55%, restrictive diastolic function, mild AI, moderate AS (AVA 1.1 cm^2), moderate to severe MR, normal RV size and systolic function, PA systolic pressure 61 mmHg.  7. Hyperlipidemia  8. Pulmonary Nodule 9. Aortic stenosis: moderate.  10. MItral  regurgitation: moderate to severe on 2/12 echo, mild to moderate on 5/13 echo.  Moderate to severe on 3/15 echo.  11. Asthma 12. Carotid dopplers (9/12): 0-39% bilateral ICA stenosis.  13. Ascending aortic aneurysm: CTA chest 11/13 with 4.4 cm ascending aortic aneurysm (stable compared to prior).  CTA chest 12/14 with 4.4 cm ascending aortic aneurysm.    Current Outpatient Prescriptions  Medication Sig Dispense Refill  . calcium carbonate (OS-CAL) 600 MG TABS Take 600 mg by mouth 2 (two) times daily with a meal.        . citalopram (CELEXA) 20 MG tablet Take 20 mg by mouth daily.        . Cyanocobalamin (B-12) 2000 MCG TABS Take 1 tablet by mouth daily.      Marland Kitchen donepezil (ARICEPT) 10 MG tablet Take 10 mg by mouth at bedtime as needed.        . ferrous sulfate 325 (65 FE) MG tablet Take 325 mg by mouth daily.      Marland Kitchen letrozole (FEMARA) 2.5 MG tablet Take 1 tablet by mouth Daily.      Marland Kitchen levothyroxine (SYNTHROID, LEVOTHROID) 25 MCG tablet Take 25 mcg by mouth daily.        . Memantine HCl ER (NAMENDA XR) 14 MG CP24 Take 1 tablet (14 mg total) by mouth daily.  30 capsule  5  . metoprolol succinate (TOPROL-XL) 25 MG 24 hr tablet Take 1 tablet (25 mg total) by mouth daily.      . potassium chloride SA (K-DUR,KLOR-CON) 20 MEQ tablet 1 tablet (20 mEq total) 2 (two) times daily.      . simvastatin (ZOCOR) 20 MG tablet Take 20 mg by mouth at bedtime.        Marland Kitchen tiotropium (SPIRIVA) 18 MCG inhalation capsule Place 18 mcg into inhaler and inhale daily.        Marland Kitchen torsemide (DEMADEX) 20 MG tablet Take 80 mg (4 tabs) in AM and 40 mg (2 tabs) in PM  180 tablet  3  . warfarin (COUMADIN) 2.5 MG tablet Take 1 tablet (2.5 mg total) by mouth as directed.  180 tablet  1   No current facility-administered medications for this encounter.    Allergies: Allergies  Allergen Reactions  . Lasix [Furosemide]     Discontinued by Dr Sandi Mariscal  . Penicillins   . Sulfonamide Derivatives     History  Substance Use Topics   . Smoking status: Never Smoker   . Smokeless tobacco: Never Used  . Alcohol Use: No     Comment: very infrequently    Family History:  Family hx of heart disease,cancer, and arthritis.  Brother- pancreatic cancer  ROS: All systems reviewed and negative except as per HPI.   PHYSICAL EXAM: VS:  BP 96/48  Pulse 54  Wt 153 lb 1.9 oz (69.455 kg)  SpO2 94%  Well nourished, well developed, in no acute distress HEENT: normal Neck: JVP not elevated Cardiac:  S1, S2, regular; 3/6 systolic murmur RUSB/apex.  Lungs:  Crackles at bases bilaterally.   Abd: soft, nontender  Ext: Trace ankle edema.  Skin: warm and  dry Neuro: Alert, oriented, memory is deficient Psych: pleasant   Assessment/Plan:  ATRIAL FIBRILLATION  Still in NSR.  She will continue on lower dose Toprol XL at 25 mg daily.  She will continue on warfarin.   Chronic diastolic heart failure   EF 55% on most recent echo with moderate AS, moderate to severe MR, and moderate pulmonary hypertension.  She is symptomatically improved, NYHA class III.  She is not getting lightheaded now and BP is higher now that we've cut back on Toprol XL.  She does not appear particularly volume overloaded.   - Continue torsemide 80 qam, 40 qpm.  - She will need to continue to use oxygen with ambulation and at night.   Valvular Heart Disease Moderate to severe MR and moderate AS on 3/15 echo. Would not be a candidate for intervention on her valvular disease.  CKD Creatinine improved.  Will continue current torsemide as her volume status looks good.  Ascending aortic aneurysm Stable at 4.4 cm in 12/14.  She would not be a good surgical candidate and aneurysm has been stable.  I do not think she needs further evaluation of this.   Mrs Sabree is going to be moving to [place] to live in an assisted living near her daughter.  She already has an appointment for next week with a cardiologist down there.  We would be glad to send records for this  delightful woman.   Larey Dresser 09/23/2013

## 2013-09-23 NOTE — Patient Instructions (Signed)
Call if any issues.  Do the following things EVERYDAY: 1) Weigh yourself in the morning before breakfast. Write it down and keep it in a log. 2) Take your medicines as prescribed 3) Eat low salt foods-Limit salt (sodium) to 2000 mg per day.  4) Stay as active as you can everyday 5) Limit all fluids for the day to less than 2 liters 6)

## 2013-09-24 ENCOUNTER — Ambulatory Visit (INDEPENDENT_AMBULATORY_CARE_PROVIDER_SITE_OTHER): Payer: Medicare Other | Admitting: Cardiology

## 2013-09-24 DIAGNOSIS — I4891 Unspecified atrial fibrillation: Secondary | ICD-10-CM

## 2013-09-24 DIAGNOSIS — Z7901 Long term (current) use of anticoagulants: Secondary | ICD-10-CM

## 2013-12-09 ENCOUNTER — Ambulatory Visit: Payer: Medicare Other | Admitting: Neurology

## 2013-12-22 ENCOUNTER — Telehealth: Payer: Self-pay | Admitting: Cardiology

## 2013-12-22 NOTE — Telephone Encounter (Signed)
I spoke with daughter & she is aware when pt herself discontinued amiodarone in 2011 & the digoxin was discontinue 06/04/13 slow heart rate Daughter states pt is in [place] due to a fall & atrial fib again States her dementia has gotten worse. Horton Chin RN

## 2013-12-22 NOTE — Telephone Encounter (Signed)
New problem   Pt's daughter want to know why Dr Aundra Dubin took her mom off of Amiodarone and Digoxin.Please call pt's daughter.

## 2014-03-27 DEATH — deceased

## 2014-03-29 ENCOUNTER — Other Ambulatory Visit: Payer: Self-pay

## 2014-03-29 DIAGNOSIS — I712 Thoracic aortic aneurysm, without rupture, unspecified: Secondary | ICD-10-CM

## 2014-04-04 ENCOUNTER — Other Ambulatory Visit: Payer: Self-pay

## 2014-04-04 DIAGNOSIS — I712 Thoracic aortic aneurysm, without rupture, unspecified: Secondary | ICD-10-CM

## 2014-04-25 ENCOUNTER — Telehealth: Payer: Self-pay | Admitting: *Deleted

## 2014-04-26 ENCOUNTER — Ambulatory Visit: Payer: Medicare Other | Admitting: Thoracic Surgery (Cardiothoracic Vascular Surgery)

## 2014-04-26 ENCOUNTER — Inpatient Hospital Stay: Admission: RE | Admit: 2014-04-26 | Payer: Medicare Other | Source: Ambulatory Visit

## 2015-05-02 IMAGING — CT CT ANGIO CHEST
3 of 6 series · 17 of 36 positions shown · IV contrast (60CC OMNI 350)
Comparison: Chest CT 04/14/2012.

CLINICAL DATA: Followup evaluation for thoracic aortic aneurysm.

EXAM:
CT ANGIOGRAPHY CHEST WITH CONTRAST
TECHNIQUE: Multidetector CT imaging of the chest was performed using the
standard protocol during bolus administration of intravenous
contrast. Multiplanar CT image reconstructions including MIPs were
obtained to evaluate the vascular anatomy.
CONTRAST:  60mL OMNIPAQUE IOHEXOL 350 MG/ML SOLN

[Series 4: cta prestent taa · axial · 0.70mm/px · z∈[-217,-37]mm · 4 of 122 slices shown]
[im 25/122  lung]
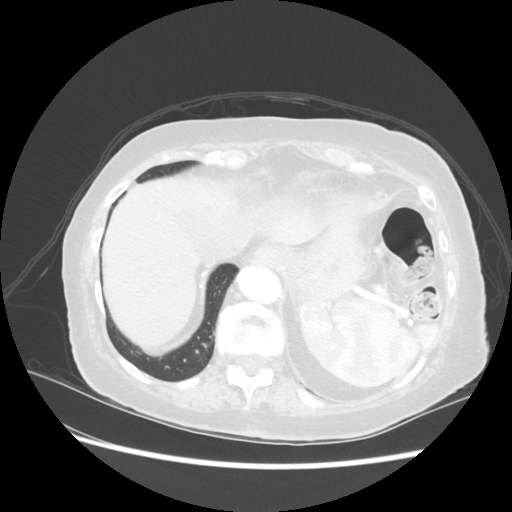
[im 49/122  mediastinal]
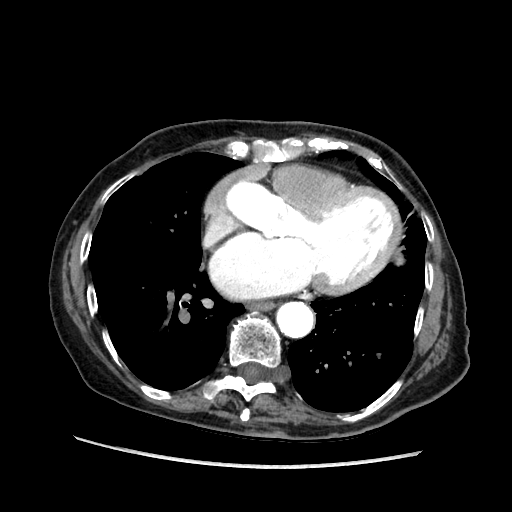
[im 73/122  lung]
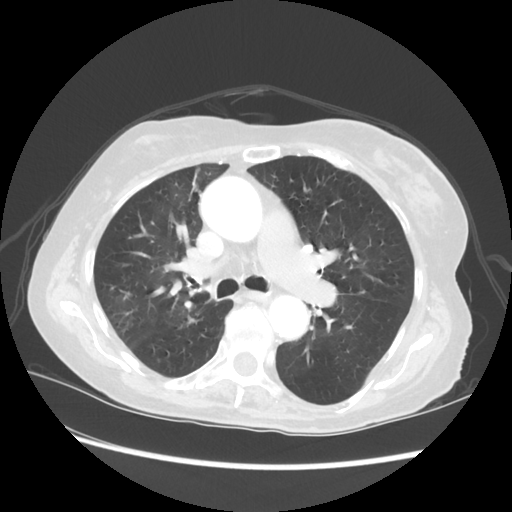
[im 97/122  mediastinal]
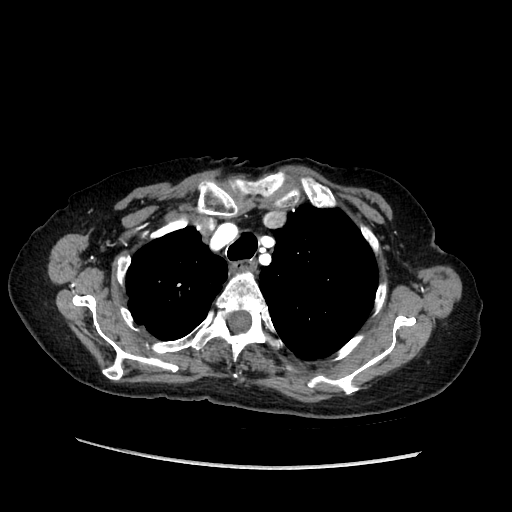

[Series 6: thin for super d · axial · 0.70mm/px · z∈[-251,+4]mm · 8 of 299 slices shown]
[im 22/299  lung]
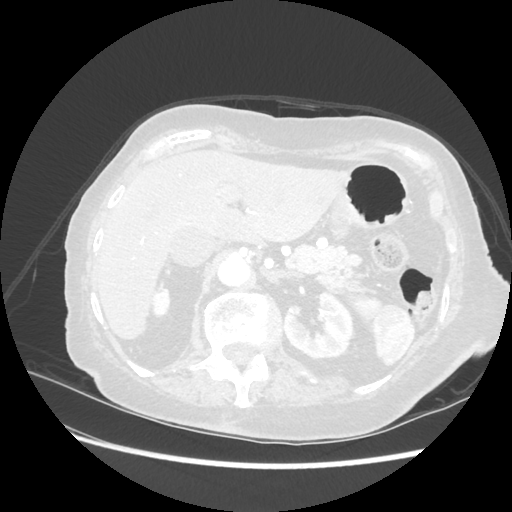
[im 64/299  lung]
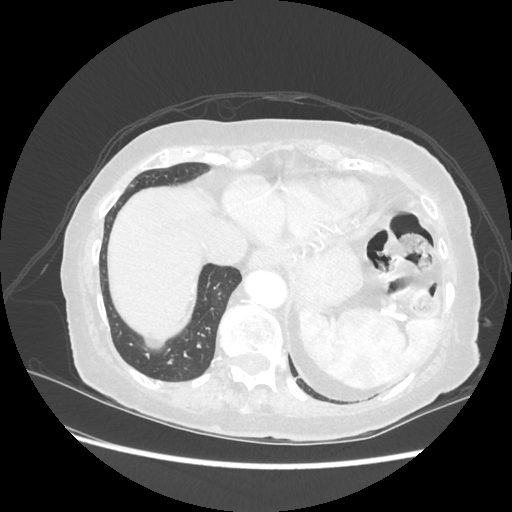
[im 107/299  lung]
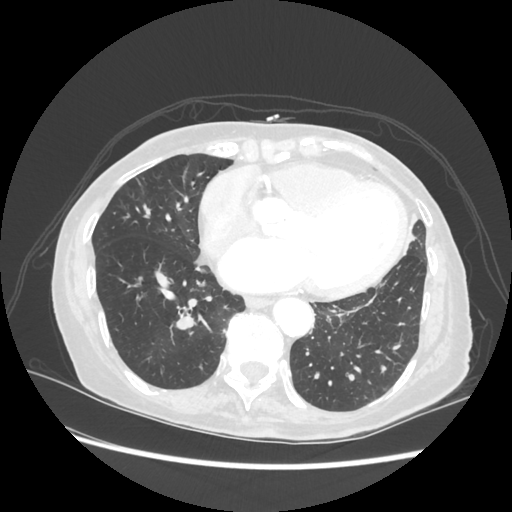
[im 128/299  lung]
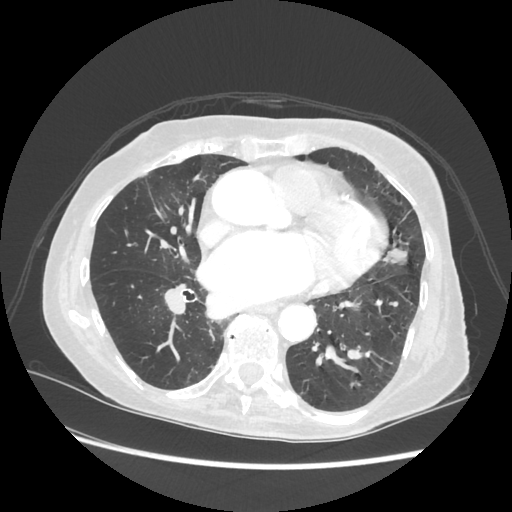
[im 171/299  lung]
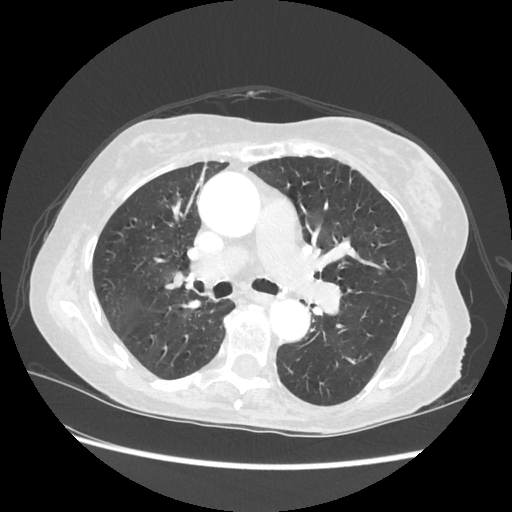
[im 192/299  lung]
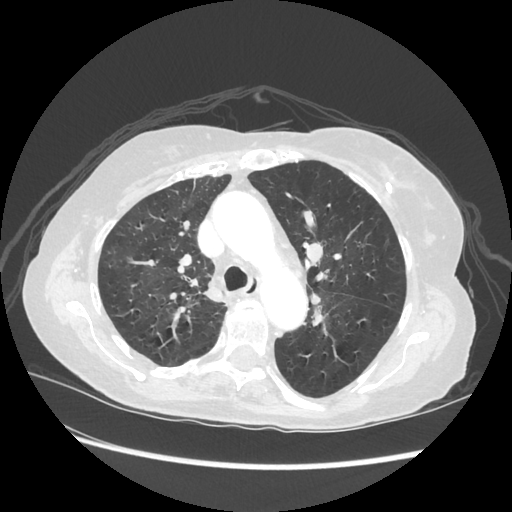
[im 235/299  lung]
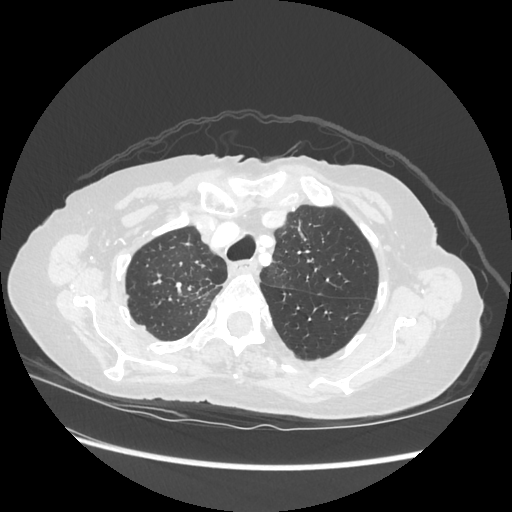
[im 277/299  lung]
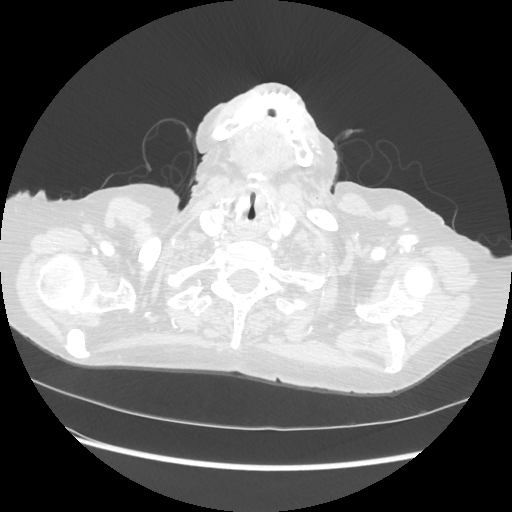

[Series 602: sagittal body · sagittal · 0.70mm/px · 5 of 145 slices shown]
[im 25/145  lung]
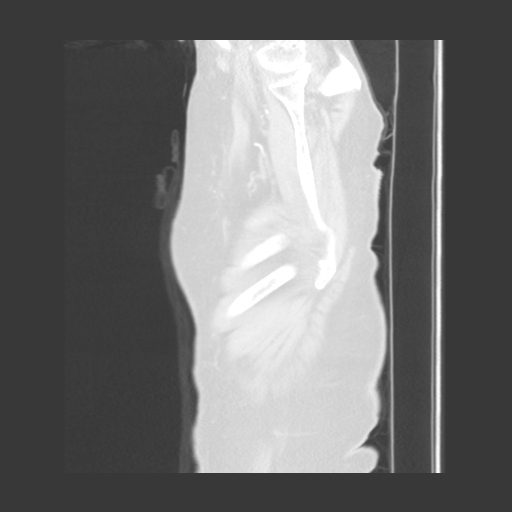
[im 49/145  lung]
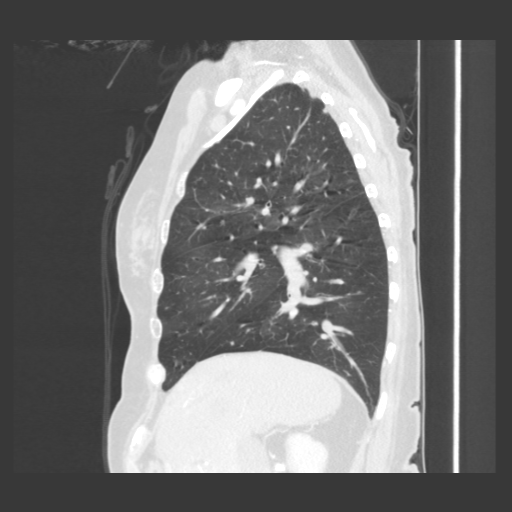
[im 73/145  lung]
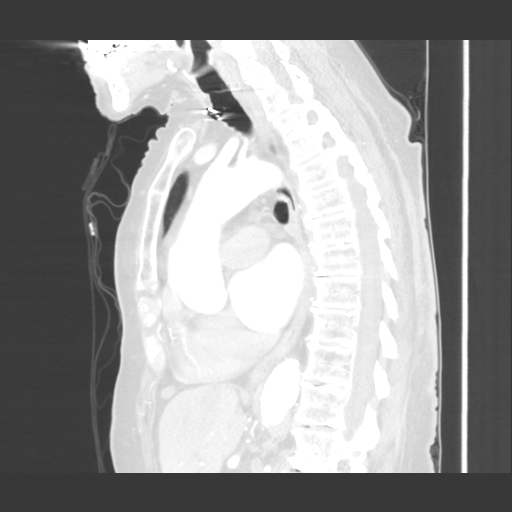
[im 97/145  lung]
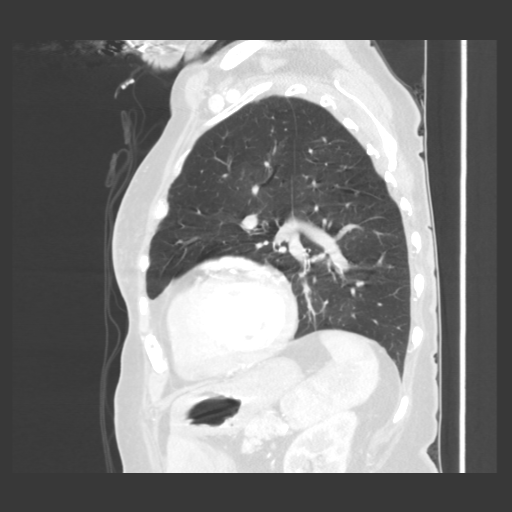
[im 121/145  lung]
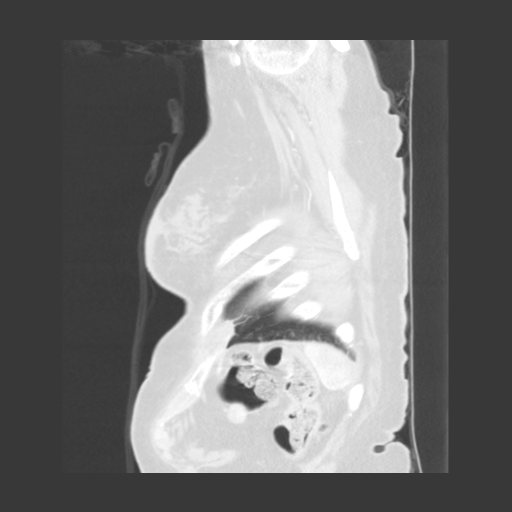

[17 of 36 positions shown; findings below may reference images not displayed]

FINDINGS: Mediastinum: Ectasia of the ascending thoracic aorta is noted,
measuring up to 4.5 x 4.3 cm (image 55 of series 4). The thoracic
aortic arch and descending thoracic aorta are more normal in caliber
measuring 2.8 cm and 2.5 cm in diameter respectively. No associated
thoracic aortic dissection. Separate origin of the left vertebral
artery directly off the aortic arch (normal anatomical variant)
incidentally noted.

Heart size is mildly enlarged. There is no significant pericardial
fluid, thickening or pericardial calcification. There is
atherosclerosis of the thoracic aorta, the great vessels of the
mediastinum and the coronary arteries, including calcified
atherosclerotic plaque in the left main, left anterior descending,
left circumflex and right coronary arteries. No pathologically
enlarged mediastinal or hilar lymph nodes. 1 cm low-attenuation
nodule in the right lobe of the thyroid gland with avid rim
enhancement, unchanged compared to the prior study from 04/14/2012
and nonspecific. Postoperative changes of left hemithyroidectomy.
Esophagus is unremarkable in appearance. Unchanged air filled cystic
region posterior lateral to the trachea at the level of the aortic
arch, likely to represent a tracheal diverticulum.

Lungs/Pleura: Previously noted nodule in the inferior segment of the
lingula has grown slightly compared to the prior study, currently
measuring 1.8 x 1.7 cm (image 36 of series 5). 8 mm pleural-based
nodule in the periphery of the lateral segment of the right middle
lobe (image 44 of series 5), also slightly larger than the prior
examination. Multiple other smaller pulmonary nodules are similar in
size, number and distribution to the prior examination as well,
generally ranging in size from 2-4 mm. No acute consolidative
airspace disease. No pleural effusions.

Upper Abdomen: 7 mm low attenuation lesion in the right lobe of the
liver is incompletely characterized, but similar to prior studies,
favored to represent a small cyst.

Musculoskeletal: There are no aggressive appearing lytic or blastic
lesions noted in the visualized portions of the skeleton.

Review of the MIP images confirms the above findings.
IMPRESSION: 1. Ectasia of the ascending thoracic aorta redemonstrated, with an
average diameter of approximately 4.4 cm. This is essentially stable
compared to the prior study.
2. Slight interval enlargement of pulmonary nodules in the inferior
segment of the lingula and lateral segment of the right middle lobe.
Other pulmonary nodules are otherwise stable in size and number.
Notably, the lingular nodule has slowly grown since 9880, at which
time this nodule demonstrated hypermetabolism on the PET-CT.
3. Additional incidental findings, as above.
